# Patient Record
Sex: Female | Born: 1975 | Race: White | Hispanic: No | Marital: Married | Smoking: Never smoker
Health system: Southern US, Community
[De-identification: ages and names within clinical notes are randomized; demographics above are authoritative.]

## PROBLEM LIST (undated history)

## (undated) DIAGNOSIS — A6000 Herpesviral infection of urogenital system, unspecified: Secondary | ICD-10-CM

## (undated) DIAGNOSIS — I10 Essential (primary) hypertension: Secondary | ICD-10-CM

## (undated) DIAGNOSIS — E785 Hyperlipidemia, unspecified: Secondary | ICD-10-CM

## (undated) DIAGNOSIS — F419 Anxiety disorder, unspecified: Secondary | ICD-10-CM

## (undated) DIAGNOSIS — L920 Granuloma annulare: Secondary | ICD-10-CM

## (undated) HISTORY — DX: Hyperlipidemia, unspecified: E78.5

## (undated) HISTORY — DX: Essential (primary) hypertension: I10

## (undated) HISTORY — PX: NO PAST SURGERIES: SHX2092

## (undated) HISTORY — DX: Granuloma annulare: L92.0

## (undated) HISTORY — DX: Anxiety disorder, unspecified: F41.9

## (undated) HISTORY — DX: Herpesviral infection of urogenital system, unspecified: A60.00

---

## 2006-02-08 ENCOUNTER — Observation Stay: Payer: Self-pay

## 2006-05-12 ENCOUNTER — Inpatient Hospital Stay: Payer: Self-pay

## 2014-02-01 ENCOUNTER — Ambulatory Visit: Payer: Self-pay | Admitting: Otolaryngology

## 2014-02-21 ENCOUNTER — Ambulatory Visit: Payer: Self-pay | Admitting: Otolaryngology

## 2014-05-13 LAB — SURGICAL PATHOLOGY

## 2014-05-19 NOTE — Op Note (Signed)
PATIENT NAME:  Kim Martin, Kim Martin MR#:  [id number] DATE OF BIRTH:  [birth year]  DATE OF PROCEDURE:  02/21/2014  PREOPERATIVE DIAGNOSIS: Left submandibular gland mass.   POSTOPERATIVE DIAGNOSIS: Left submandibular gland mass.  PROCEDURE PERFORMED: Submandibular gland excision.   SURGEON: Kyung Ruddreighton C. Jamaar Howes, MD   ASSISTANT: Marion DownerScott Bennett, MD   ANESTHESIA: General endotracheal anesthesia.   ESTIMATED BLOOD LOSS: Less than 10 mL.   INTRAVENOUS FLUIDS: Please see anesthesia record.   COMPLICATIONS: None.   DRAINS AND STENT PLACEMENTS: TLS drain and Surgicel.   SPECIMENS: Left surgical gland excision.   COMPLICATIONS: None.   INDICATIONS FOR PROCEDURE: The patient is a 39 year old female with a history of a left submandibular mass that has been increasing in size, found to have hard firmness and a large lesion on CT scan.   OPERATIVE FINDINGS: A very large left submandibular gland with a mass within the lateral midportion of the gland. The marginal mandibular nerve was identified and preserved. The lingual nerve was identified and preserved. The hypoglossal nerve was identified and preserved.   DESCRIPTION OF PROCEDURE: After the patient was identified in holding, benefits and risks of the procedure were discussed and consent was reviewed. The patient's site was marked. The patient was taken to the operating room and placed in the supine position. General endotracheal anesthesia was induced. The patient was rotated 90 degrees and 5 mL of 1% lidocaine 1:100,000 epinephrine was injected in the patient's left anterior neck, and the left anterior neck crease was marked, and the patient was prepped and draped in a sterile fashion. A 15 blade scalpel was used to make an incision along the marked anterior neck crease. Dissection was carried down through subcutaneous tissues and fat to the platysma muscle, this was divided. Care was taken at this point to avoid injury of the marginal mandibular nerve.  The cervical branch of the facial nerve was identified as well as the marginal mandibular branch more superiorly. The inferior edge of the submandibular gland was identified. The fascia overlying the submandibular gland was incised inferiorly. This was retracted superiorly for protection of the cervical branch as well as the marginal mandibular branch of the facial nerve. This demonstrated a very large lobulated submandibular gland. At this time, a combination of sharp techniques and a Harmonic scalpel and blunt techniques were used to circumferentially dissect around the left submandibular gland more anteriorly, inferiorly and posteriorly.  Posteriorly, a branch off the facial artery was ligated. The facial artery was able to be preserved. Dissection carried more laterally, was noted to have a very hard mass adherent to the overlying fascia. This was carefully dissected with a small-tipped hemostat and this was right at the cervical branch of the facial nerve. At this time, the fascia was able to be retracted more superiorly and the superior dissection of the submandibular gland was made. The mylohyoid muscle was identified. The anterior and posterior belly of the digastric were also identified. The Wharton duct was identified and this was ligated just inferior to the ganglion from the lingual nerve with a Harmonic scalpel and then the remaining attachments as well as a large vein was also ligated with the Harmonic scalpel. The remaining attachments of the submandibular gland next were removed. The wound was copiously irrigated. A specimen was sent for permanent pathological evaluation. Visualization of the wound revealed good hemostasis. The lingual and hypoglossal nerves were identified. The marginal mandibular gland within the fascia was stimulated robustly. At this time, a TLS drain was placed  in the patient's left neck in the wound. The platysma was reapproximated using interrupted 4-0 Vicryl sutures after  placement of Surgicel, and then the subcutaneous tissue was reapproximated with 4-0 Vicryls, and then the skin was closed with Dermabond skin adhesive. Care of the patient at this time was transferred to anesthesia, where the patient tolerated the procedure well.    ____________________________ Kyung Rudd, MD ccv:TT D: 02/21/2014 09:18:14 ET T: 02/21/2014 14:16:59 ET JOB#: [id number]  cc: Kyung Rudd, MD, <Dictator> Kyung Rudd MD ELECTRONICALLY SIGNED 02/28/2014 8:38

## 2014-12-23 DIAGNOSIS — F411 Generalized anxiety disorder: Secondary | ICD-10-CM | POA: Insufficient documentation

## 2014-12-23 DIAGNOSIS — E785 Hyperlipidemia, unspecified: Secondary | ICD-10-CM | POA: Insufficient documentation

## 2015-11-24 DIAGNOSIS — E669 Obesity, unspecified: Secondary | ICD-10-CM | POA: Insufficient documentation

## 2016-01-23 LAB — HM PAP SMEAR: HM PAP: NORMAL

## 2016-02-02 ENCOUNTER — Other Ambulatory Visit: Payer: Self-pay | Admitting: Obstetrics and Gynecology

## 2016-02-02 DIAGNOSIS — Z1231 Encounter for screening mammogram for malignant neoplasm of breast: Secondary | ICD-10-CM

## 2016-02-10 ENCOUNTER — Encounter: Payer: Self-pay | Admitting: Radiology

## 2016-02-10 ENCOUNTER — Ambulatory Visit
Admission: RE | Admit: 2016-02-10 | Discharge: 2016-02-10 | Disposition: A | Discharge status: Home / Self Care | Payer: Managed Care, Other (non HMO) | Source: Ambulatory Visit | Attending: Obstetrics and Gynecology | Admitting: Obstetrics and Gynecology

## 2016-02-10 DIAGNOSIS — Z1231 Encounter for screening mammogram for malignant neoplasm of breast: Secondary | ICD-10-CM | POA: Diagnosis present

## 2016-02-13 ENCOUNTER — Other Ambulatory Visit: Payer: Self-pay | Admitting: Obstetrics and Gynecology

## 2016-02-13 DIAGNOSIS — R928 Other abnormal and inconclusive findings on diagnostic imaging of breast: Secondary | ICD-10-CM

## 2016-02-13 DIAGNOSIS — N6489 Other specified disorders of breast: Secondary | ICD-10-CM

## 2016-02-27 ENCOUNTER — Ambulatory Visit
Admission: RE | Admit: 2016-02-27 | Discharge: 2016-02-27 | Disposition: A | Discharge status: Home / Self Care | Payer: Managed Care, Other (non HMO) | Source: Ambulatory Visit | Attending: Obstetrics and Gynecology | Admitting: Obstetrics and Gynecology

## 2016-02-27 DIAGNOSIS — N6489 Other specified disorders of breast: Secondary | ICD-10-CM

## 2016-02-27 DIAGNOSIS — R928 Other abnormal and inconclusive findings on diagnostic imaging of breast: Secondary | ICD-10-CM

## 2017-01-24 ENCOUNTER — Ambulatory Visit: Payer: Self-pay | Admitting: Obstetrics and Gynecology

## 2017-02-02 ENCOUNTER — Other Ambulatory Visit: Payer: Self-pay | Admitting: Obstetrics and Gynecology

## 2017-02-04 ENCOUNTER — Encounter: Payer: Self-pay | Admitting: Obstetrics and Gynecology

## 2017-02-04 ENCOUNTER — Ambulatory Visit (INDEPENDENT_AMBULATORY_CARE_PROVIDER_SITE_OTHER): Payer: Managed Care, Other (non HMO) | Admitting: Obstetrics and Gynecology

## 2017-02-04 ENCOUNTER — Other Ambulatory Visit: Payer: Self-pay | Admitting: Obstetrics and Gynecology

## 2017-02-04 VITALS — BP 124/78 | Ht 65.0 in | Wt 230.0 lb

## 2017-02-04 DIAGNOSIS — Z1331 Encounter for screening for depression: Secondary | ICD-10-CM

## 2017-02-04 DIAGNOSIS — Z1339 Encounter for screening examination for other mental health and behavioral disorders: Secondary | ICD-10-CM | POA: Diagnosis not present

## 2017-02-04 DIAGNOSIS — Z1231 Encounter for screening mammogram for malignant neoplasm of breast: Secondary | ICD-10-CM

## 2017-02-04 DIAGNOSIS — Z01419 Encounter for gynecological examination (general) (routine) without abnormal findings: Secondary | ICD-10-CM | POA: Diagnosis not present

## 2017-02-04 DIAGNOSIS — N760 Acute vaginitis: Secondary | ICD-10-CM | POA: Diagnosis not present

## 2017-02-04 LAB — POCT WET PREP WITH KOH
Clue Cells Wet Prep HPF POC: NEGATIVE
KOH PREP POC: NEGATIVE
PH, VAGINAL: 4.5
Trichomonas, UA: NEGATIVE

## 2017-02-04 NOTE — Progress Notes (Signed)
Gynecology Annual Exam   PCP: [facility]  Chief Complaint  Patient presents with  . Annual Exam   History of Present Illness:  [name] is a 42 y.o. [phone] established patient whose LMP was No LMP recorded. Patient is not currently having periods (Reason: IUD)., presents today for her annual examination.  Her menses are infrequent and light due to her Mirena IUD.  She does not have vasomotor sx.   She is single partner, contraception - IUD. She does not have vaginal dryness.  Last Pap: 1 year ago  Results were: no abnormalities /neg HPV DNA.  Hx of STDs: HSV  Last mammogram: 1 year ago  Results were: normal--routine follow-up in 12 months There is no FH of breast cancer. There is no FH of ovarian cancer. The patient does not do self-breast exams.  Colonoscopy: not done  DEXA: has not been screened for osteoporosis  Tobacco use: The patient denies current or previous tobacco use. Alcohol use: social drinker Exercise: not active  The patient wears seatbelts: yes.     She has noticed an "onion"-like odor to her discharge. She has no vaginal irritative symptoms.  Her discharge is unchanged.  She has no issues with intercourse. Denies fevers, chills, abdominal pain.  Past Medical History:  Diagnosis Date  . Anxiety   . Herpes genitalis   . Hyperlipidemia    Past Surgical History: denies  Medications   Medication Sig Start Date End Date Taking? Authorizing Provider  levonorgestrel (MIRENA) 20 MCG/24HR IUD 1 each by Intrauterine route once.   Yes [provider]  citalopram (CELEXA) 20 MG tablet Take 20 mg by mouth daily. 12/03/16   [provider]  lisinopril-hydrochlorothiazide (PRINZIDE,ZESTORETIC) 10-12.5 MG tablet Take 1 tablet by mouth daily. 12/03/16   [provider]  lovastatin (MEVACOR) 40 MG tablet TAKE 1 TABLET (40 MG TOTAL) BY MOUTH DAILY WITH DINNER. 12/03/16   [provider]   Allergies: No  Known Allergies  Obstetric History: [id number]  Family History  Problem Relation Age of Onset  . COPD Mother   . Hypercholesterolemia Father   . Breast cancer Neg Hx     Social History   Socioeconomic History  . Marital status: Divorced    Spouse name: Not on file  . Number of children: Not on file  . Years of education: Not on file  . Highest education level: Not on file  Social Needs  . Financial resource strain: Not on file  . Food insecurity - worry: Not on file  . Food insecurity - inability: Not on file  . Transportation needs - medical: Not on file  . Transportation needs - non-medical: Not on file  Occupational History  . Not on file  Tobacco Use  . Smoking status: Never Smoker  . Smokeless tobacco: Never Used  Substance and Sexual Activity  . Alcohol use: No    Frequency: Never  . Drug use: No  . Sexual activity: Yes    Birth control/protection: IUD  Other Topics Concern  . Not on file  Social History Narrative  . Not on file    Review of Systems  Constitutional: Negative.   HENT: Negative.   Eyes: Negative.   Respiratory: Negative.   Cardiovascular: Negative.   Gastrointestinal: Negative.   Genitourinary: Negative.   Musculoskeletal: Negative.   Skin: Negative.   Neurological: Negative.   Psychiatric/Behavioral: Negative.      Physical Exam BP 124/78   Ht  5\' 5"  (1.651 m)   Wt 230 lb (104.3 kg)   BMI 38.27 kg/m   Physical Exam  Constitutional: She is oriented to person, place, and time. She appears well-developed and well-nourished. No distress.  Genitourinary: Uterus normal. Pelvic exam was performed with patient supine. There is no rash, tenderness, lesion or injury on the right labia. There is no rash, tenderness, lesion or injury on the left labia. No erythema, tenderness or bleeding in the vagina. No signs of injury around the vagina. No vaginal discharge found. Right adnexum does not display mass, does not display tenderness and does not  display fullness. Left adnexum does not display mass, does not display tenderness and does not display fullness.  Cervix exhibits visible IUD strings. Cervix does not exhibit motion tenderness, lesion, discharge or polyp.   Uterus is mobile and anteverted. Uterus is not enlarged, tender or exhibiting a mass.  HENT:  Head: Normocephalic and atraumatic.  Eyes: EOM are normal. No scleral icterus.  Neck: Normal range of motion. Neck supple. No thyromegaly present.  Cardiovascular: Normal rate and regular rhythm. Exam reveals no gallop and no friction rub.  No murmur heard. Pulmonary/Chest: Effort normal and breath sounds normal. No respiratory distress. She has no wheezes. She has no rales. Right breast exhibits no inverted nipple, no mass, no nipple discharge, no skin change and no tenderness. Left breast exhibits no inverted nipple, no mass, no nipple discharge, no skin change and no tenderness.  Abdominal: Soft. Bowel sounds are normal. She exhibits no distension and no mass. There is no tenderness. There is no rebound and no guarding.  Musculoskeletal: Normal range of motion. She exhibits no edema or tenderness.  Lymphadenopathy:    She has no cervical adenopathy.       Right: No inguinal adenopathy present.       Left: No inguinal adenopathy present.  Neurological: She is alert and oriented to person, place, and time. No cranial nerve deficit.  Skin: Skin is warm and dry. No rash noted. No erythema.  Psychiatric: She has a normal mood and affect. Her behavior is normal. Judgment normal.   Female chaperone present for pelvic and breast  portions of the physical exam  Wet Prep: PH: 4.5 Clue Cells: Negative Fungal elements: Negative Trichomonas: Negative  Results: AUDIT Questionnaire (screen for alcoholism): 0 PHQ-9: 5  Assessment: 42 y.o. [id number] female here for routine gynecologic examination.  Plan: Problem List Items Addressed This Visit    None    Visit Diagnoses    Women's  annual routine gynecological examination    -  Primary   Screening for depression       Screening for alcoholism       Acute vaginitis       Relevant Orders   POCT Wet Prep with KOH   Chlamydia/Gonococcus/Trichomonas, NAA     Screening: -- Blood pressure screen normal -- Colonoscopy - not due -- Mammogram - due. Patient to call [facility] to arrange. She understands that it is her responsibility to arrange this. -- Weight screening: obese: discussed management options, including lifestyle, dietary, and exercise. -- Depression screening negative (PHQ-9) -- Nutrition: normal -- cholesterol screening: per PCP -- osteoporosis screening: not due -- tobacco screening: not using -- alcohol screening: AUDIT questionnaire indicates low-risk usage. -- family history of breast cancer screening: done. not at high risk. -- no evidence of domestic violence or intimate partner violence. -- STD screening: gonorrhea/chlamydia NAAT collected -- pap smear not collected per ASCCP guidelines --  flu vaccine patient received -- HPV vaccination series: not eligilbe  Thomasene Mohair, MD 02/04/2017 8:58 AM

## 2017-02-08 LAB — CHLAMYDIA/GONOCOCCUS/TRICHOMONAS, NAA
Chlamydia by NAA: NEGATIVE
Gonococcus by NAA: NEGATIVE
Trich vag by NAA: NEGATIVE

## 2017-02-17 ENCOUNTER — Ambulatory Visit
Admission: RE | Admit: 2017-02-17 | Discharge: 2017-02-17 | Disposition: A | Discharge status: Home / Self Care | Payer: Managed Care, Other (non HMO) | Source: Ambulatory Visit | Attending: Obstetrics and Gynecology | Admitting: Obstetrics and Gynecology

## 2017-02-17 DIAGNOSIS — Z1231 Encounter for screening mammogram for malignant neoplasm of breast: Secondary | ICD-10-CM | POA: Diagnosis not present

## 2018-01-09 ENCOUNTER — Other Ambulatory Visit: Payer: Self-pay | Admitting: Obstetrics and Gynecology

## 2018-01-09 DIAGNOSIS — Z1231 Encounter for screening mammogram for malignant neoplasm of breast: Secondary | ICD-10-CM

## 2018-02-06 ENCOUNTER — Ambulatory Visit: Payer: Managed Care, Other (non HMO) | Admitting: Obstetrics and Gynecology

## 2018-02-20 ENCOUNTER — Inpatient Hospital Stay: Admission: RE | Admit: 2018-02-20 | Payer: Managed Care, Other (non HMO) | Source: Ambulatory Visit

## 2018-02-24 ENCOUNTER — Encounter: Payer: Self-pay | Admitting: Obstetrics and Gynecology

## 2018-02-24 ENCOUNTER — Ambulatory Visit (INDEPENDENT_AMBULATORY_CARE_PROVIDER_SITE_OTHER): Payer: Managed Care, Other (non HMO) | Admitting: Obstetrics and Gynecology

## 2018-02-24 VITALS — BP 124/78 | Ht 65.0 in | Wt 228.0 lb

## 2018-02-24 DIAGNOSIS — Z01419 Encounter for gynecological examination (general) (routine) without abnormal findings: Secondary | ICD-10-CM | POA: Diagnosis not present

## 2018-02-24 DIAGNOSIS — Z1339 Encounter for screening examination for other mental health and behavioral disorders: Secondary | ICD-10-CM

## 2018-02-24 DIAGNOSIS — Z1331 Encounter for screening for depression: Secondary | ICD-10-CM

## 2018-02-24 NOTE — Progress Notes (Signed)
Gynecology Annual Exam  PCP: Orene Desanctis, MD  Chief Complaint  Patient presents with  . Annual Exam   History of Present Illness:  [name] is a 43 y.o. [phone] who LMP was No LMP recorded. (Menstrual status: IUD)., presents today for her annual examination.  Her menses are minimal with her IUD.   She does not have vasomotor symptoms.   She is sexually active and uses the IUD for contraception. She does not have vaginal dryness. Mirena IUD placed 11/2014  Last Pap: 01/2016  Results were:  NILM/HPV negative Hx of STDs: HSV  Last mammogram: 01/2017  Results were: BiRads 1 There is no FH of breast cancer. There is no FH of ovarian cancer. The patient does not do self-breast exams.  Colonoscopy: never had DEXA: has not been screened for osteoporosis  Tobacco use: The patient denies current or previous tobacco use. Alcohol use: social drinker Exercise: does walking  The patient wears seatbelts: yes.     Past Medical History:  Diagnosis Date  . Anxiety   . Herpes genitalis   . Hyperlipidemia   . Hypertension    Past Surgical History:  Procedure Laterality Date  . NO PAST SURGERIES     Prior to Admission medications   Medication Sig Start Date End Date Taking? Authorizing Provider  citalopram (CELEXA) 20 MG tablet Take 20 mg by mouth daily. 12/03/16  Yes [provider]  levonorgestrel (MIRENA) 20 MCG/24HR IUD 1 each by Intrauterine route once.   Yes [provider]  lisinopril-hydrochlorothiazide (PRINZIDE,ZESTORETIC) 10-12.5 MG tablet Take 1 tablet by mouth daily. 12/03/16  Yes [provider]  lovastatin (MEVACOR) 40 MG tablet TAKE 1 TABLET (40 MG TOTAL) BY MOUTH DAILY WITH DINNER. 12/03/16  Yes [provider]   Allergies: No Known Allergies  Obstetric History: [id number], s/p SVD  Family History  Problem Relation Age of Onset  . COPD Mother   . Hypercholesterolemia Father   . Breast cancer Neg Hx     Social History    Socioeconomic History  . Marital status: Divorced    Spouse name: Not on file  . Number of children: Not on file  . Years of education: Not on file  . Highest education level: Not on file  Occupational History  . Not on file  Social Needs  . Financial resource strain: Not on file  . Food insecurity:    Worry: Not on file    Inability: Not on file  . Transportation needs:    Medical: Not on file    Non-medical: Not on file  Tobacco Use  . Smoking status: Never Smoker  . Smokeless tobacco: Never Used  Substance and Sexual Activity  . Alcohol use: No    Frequency: Never  . Drug use: No  . Sexual activity: Yes    Birth control/protection: I.U.D.  Lifestyle  . Physical activity:    Days per week: Not on file    Minutes per session: Not on file  . Stress: Not on file  Relationships  . Social connections:    Talks on phone: Not on file    Gets together: Not on file    Attends religious service: Not on file    Active member of club or organization: Not on file    Attends meetings of clubs or organizations: Not on file    Relationship status: Not on file  . Intimate partner violence:    Fear of current or ex partner: Not on  file    Emotionally abused: Not on file    Physically abused: Not on file    Forced sexual activity: Not on file  Other Topics Concern  . Not on file  Social History Narrative  . Not on file    Review of Systems  Constitutional: Negative.   HENT: Negative.   Eyes: Negative.   Respiratory: Negative.   Cardiovascular: Negative.   Gastrointestinal: Negative.   Genitourinary: Negative.   Musculoskeletal: Negative.   Skin: Negative.   Neurological: Negative.   Psychiatric/Behavioral: Negative.      Physical Exam BP 124/78   Ht 5\' 5"  (1.651 m)   Wt 228 lb (103.4 kg)   BMI 37.94 kg/m   Physical Exam Constitutional:      General: She is not in acute distress.    Appearance: She is well-developed.  Genitourinary:     Pelvic exam was  performed with patient supine.     Uterus normal.     No inguinal adenopathy present in the right or left side.    No signs of injury in the vagina.     No vaginal discharge, erythema, tenderness or bleeding.     No cervical motion tenderness, discharge, lesion or polyp.     Uterus is mobile.     Uterus is not enlarged or tender.     No uterine mass detected.    Uterus is anteverted.     No right or left adnexal mass present.     Right adnexa not tender or full.     Left adnexa not tender or full.  HENT:     Head: Normocephalic and atraumatic.  Eyes:     General: No scleral icterus. Neck:     Musculoskeletal: Normal range of motion and neck supple.     Thyroid: No thyromegaly.  Cardiovascular:     Rate and Rhythm: Normal rate and regular rhythm.     Heart sounds: No murmur. No friction rub. No gallop.   Pulmonary:     Effort: Pulmonary effort is normal. No respiratory distress.     Breath sounds: Normal breath sounds. No wheezing or rales.  Chest:     Breasts:        Right: No inverted nipple, mass, nipple discharge, skin change or tenderness.        Left: No inverted nipple, mass, nipple discharge, skin change or tenderness.  Abdominal:     General: Bowel sounds are normal. There is no distension.     Palpations: Abdomen is soft. There is no mass.     Tenderness: There is no abdominal tenderness. There is no guarding or rebound.  Musculoskeletal: Normal range of motion.        General: No tenderness.  Lymphadenopathy:     Cervical: No cervical adenopathy.     Lower Body: No right inguinal adenopathy. No left inguinal adenopathy.  Neurological:     Mental Status: She is alert and oriented to person, place, and time.     Cranial Nerves: No cranial nerve deficit.  Skin:    General: Skin is warm and dry.     Findings: No erythema or rash.  Psychiatric:        Behavior: Behavior normal.        Judgment: Judgment normal.   Female chaperone present for pelvic and breast   portions of the physical exam  Results: AUDIT Questionnaire (screen for alcoholism): 1 PHQ-9: 3  Assessment: 43 y.o. 152P2002 female here for routine  gynecologic examination.  Plan: Problem List Items Addressed This Visit    None    Visit Diagnoses    Women's annual routine gynecological examination    -  Primary   Screening for depression       Screening for alcoholism          Screening: -- Blood pressure screen managed by PCP -- Colonoscopy - not due -- [facility] - [facility] -- Weight screening: obese: discussed management options, including lifestyle, dietary, and exercise. -- Depression screening negative (PHQ-9) -- Nutrition: normal -- cholesterol screening: per PCP -- osteoporosis screening: not due -- tobacco screening: not using -- alcohol screening: AUDIT questionnaire indicates low-risk usage. -- family history of breast cancer screening: done. not at high risk. -- no evidence of domestic violence or intimate partner violence. -- STD screening: gonorrhea/chlamydia NAAT not collected per patient request. -- pap smear not collected per ASCCP guidelines -- flu vaccine patient declined -- HPV vaccination series: not eligilbe  Thomasene MohairStephen Jackson, MD 02/24/2018 8:27 AM

## 2018-02-27 ENCOUNTER — Ambulatory Visit
Admission: RE | Admit: 2018-02-27 | Discharge: 2018-02-27 | Disposition: A | Discharge status: Home / Self Care | Payer: Managed Care, Other (non HMO) | Source: Ambulatory Visit | Attending: Obstetrics and Gynecology | Admitting: Obstetrics and Gynecology

## 2018-02-27 DIAGNOSIS — Z1231 Encounter for screening mammogram for malignant neoplasm of breast: Secondary | ICD-10-CM | POA: Insufficient documentation

## 2018-05-18 IMAGING — MG MM DIGITAL SCREENING BILAT W/ CAD
3 series · 3 of 3 positions shown · non-contrast
Comparison: None.

CLINICAL DATA: Screening. Baseline exam.

EXAM:
DIGITAL SCREENING BILATERAL MAMMOGRAM WITH CAD

[R MLO]
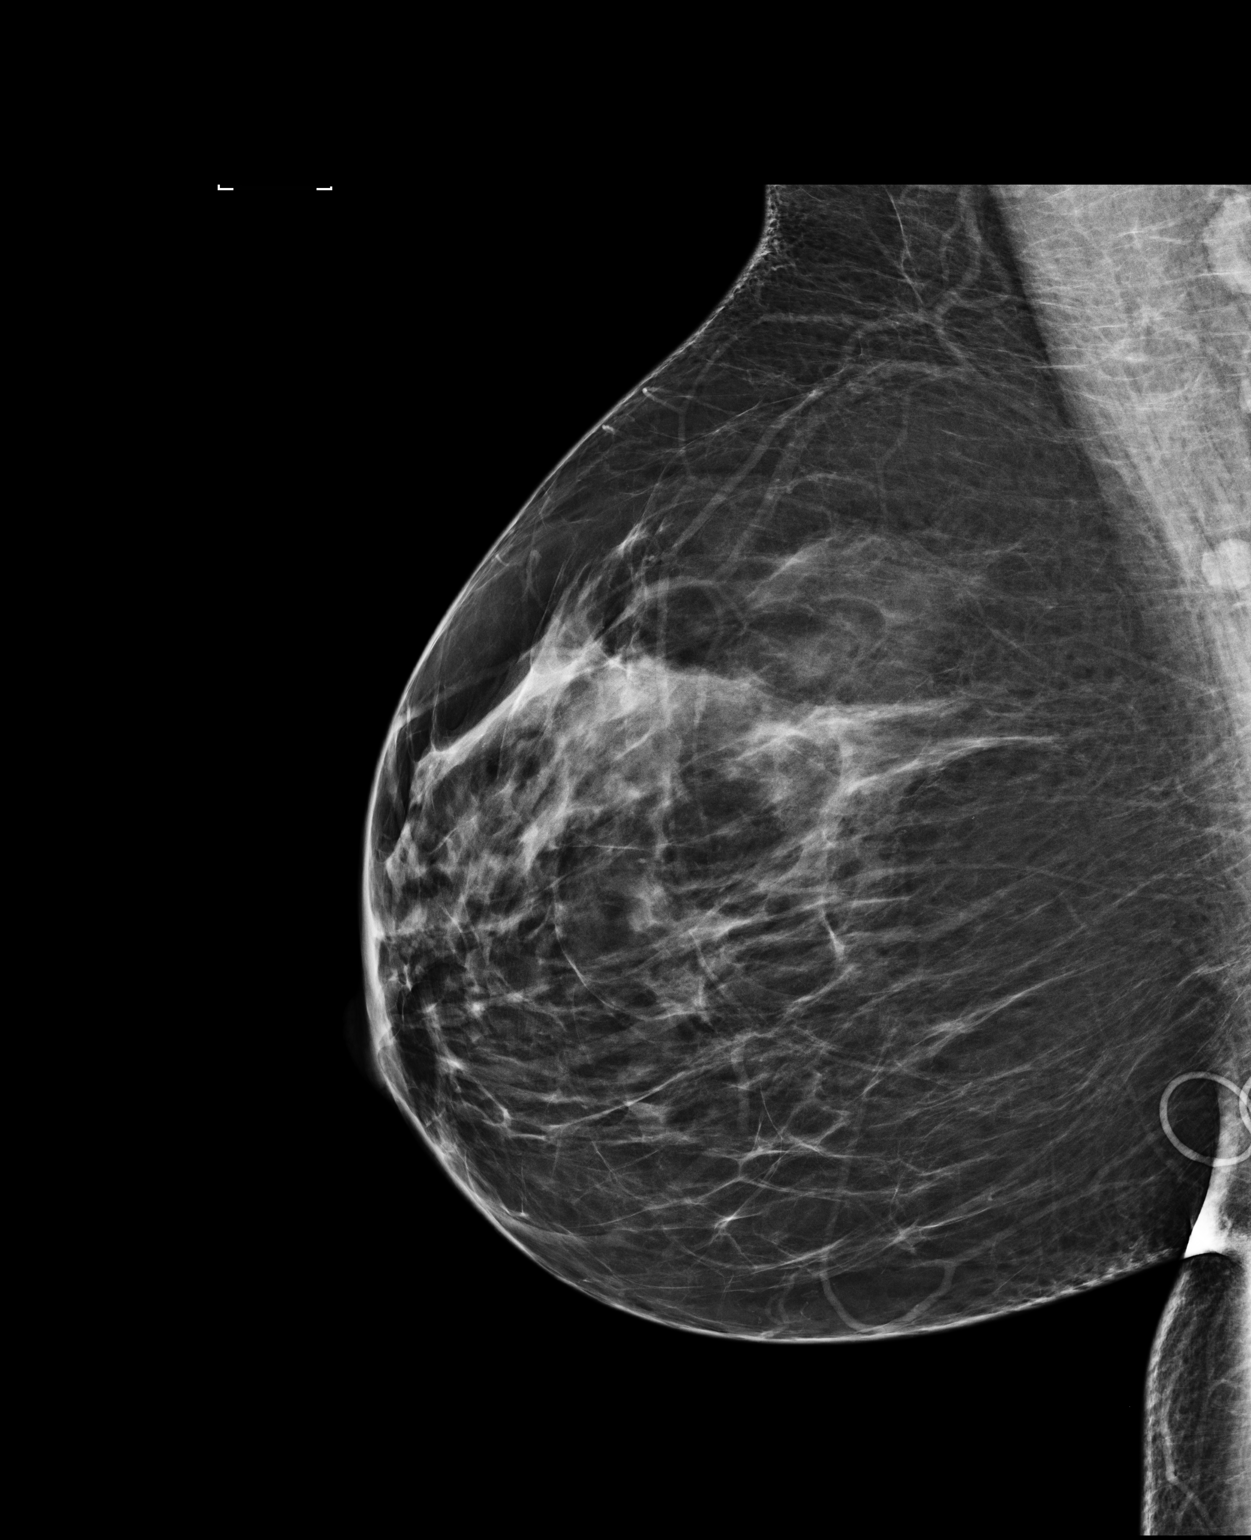

[L MLO]
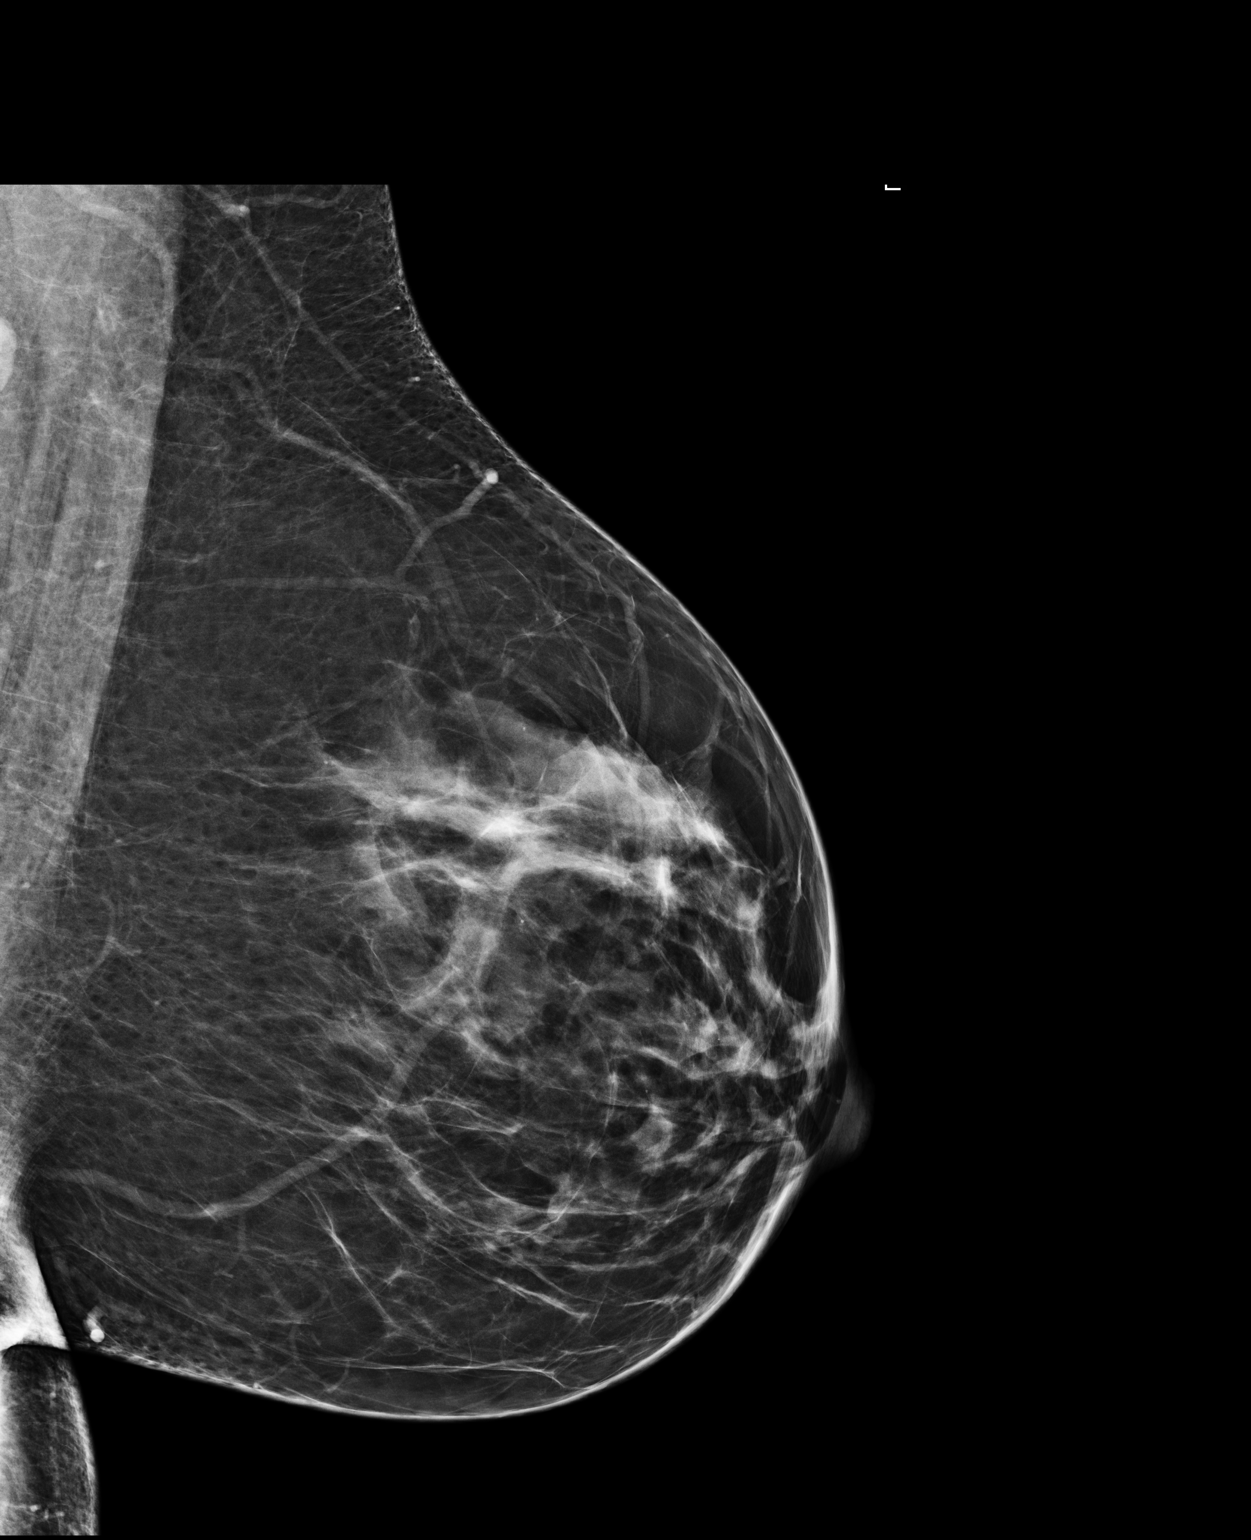

[R CC]
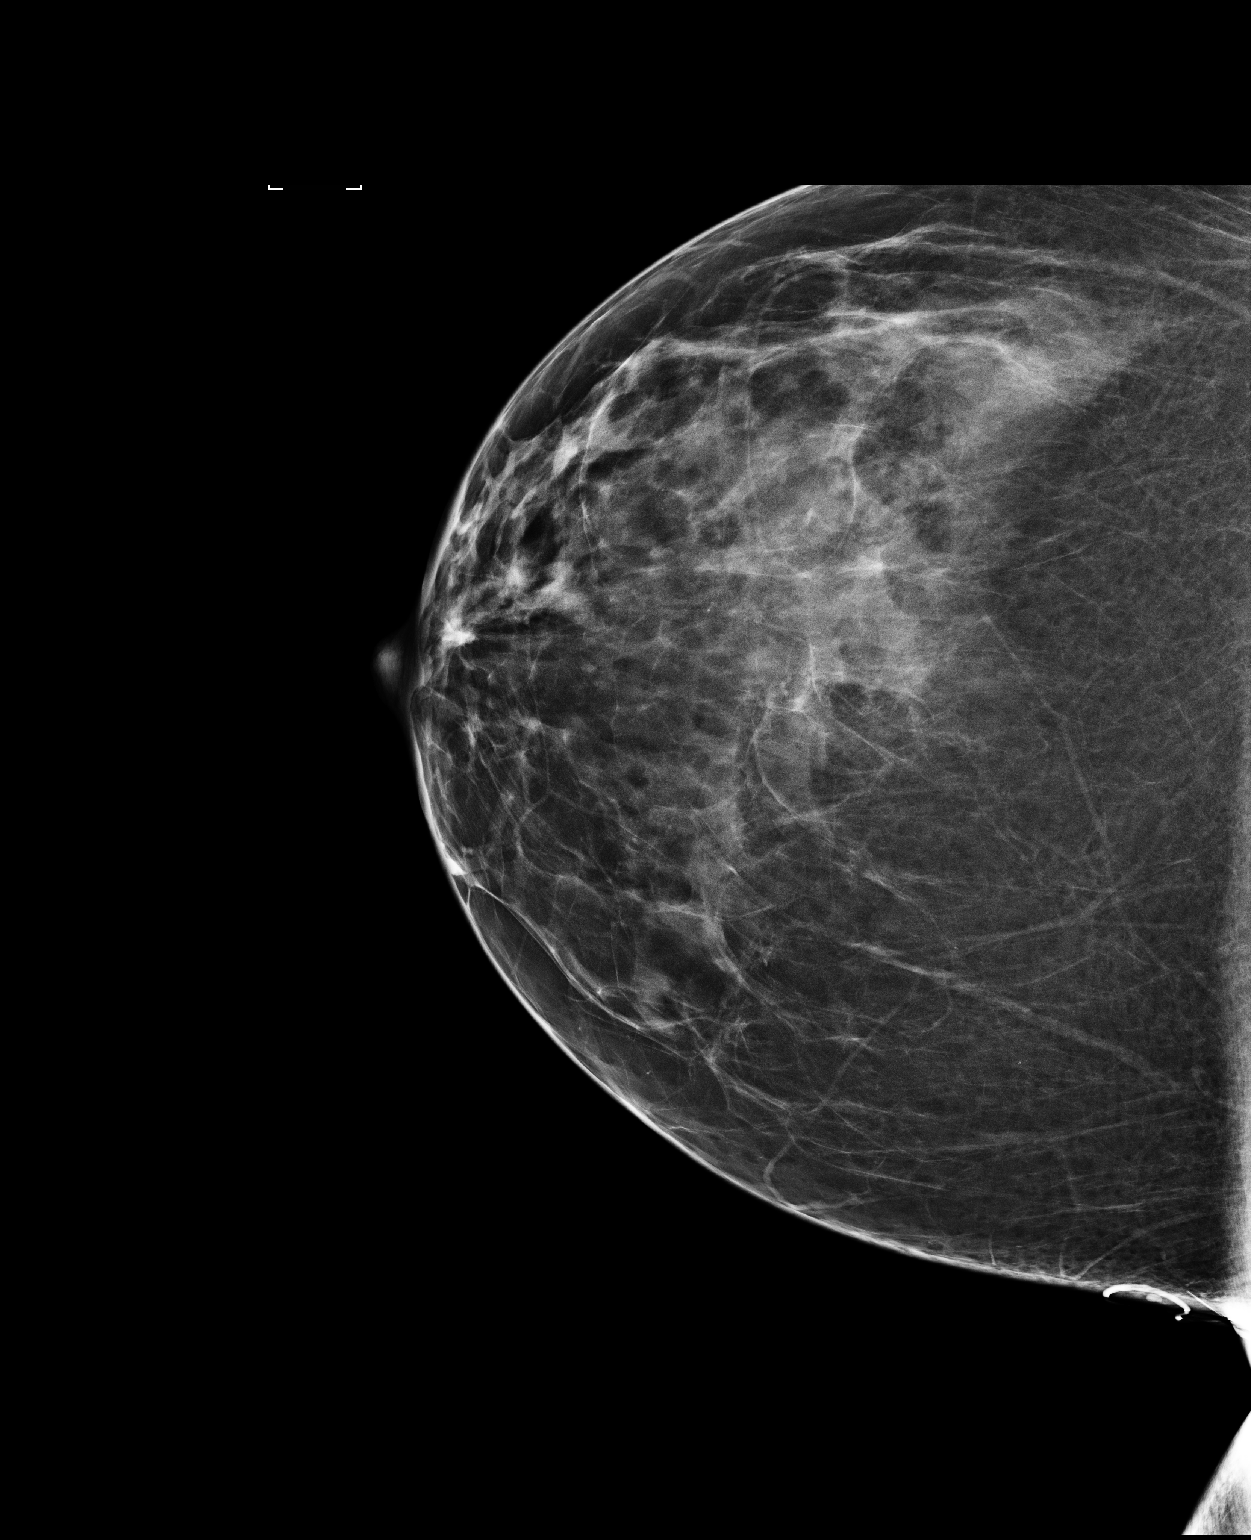

[3 of 3 positions shown; findings below may reference images not displayed]

ACR Breast Density Category b: There are scattered areas of
fibroglandular density.
FINDINGS: In the left breast, a possible asymmetry warrants further
evaluation. In the right breast, no findings suspicious for
malignancy. Images were processed with CAD.
IMPRESSION: Further evaluation is suggested for possible asymmetry in the left
breast.

RECOMMENDATION:
Diagnostic mammogram and possibly ultrasound of the left breast.
(Code:[id number])

The patient will be contacted regarding the findings, and additional
imaging will be scheduled.

BI-RADS CATEGORY  0: Incomplete. Need additional imaging evaluation
and/or prior mammograms for comparison.

## 2018-11-07 DIAGNOSIS — K649 Unspecified hemorrhoids: Secondary | ICD-10-CM | POA: Insufficient documentation

## 2019-02-14 ENCOUNTER — Other Ambulatory Visit: Payer: Self-pay | Admitting: Obstetrics and Gynecology

## 2019-02-14 DIAGNOSIS — Z1231 Encounter for screening mammogram for malignant neoplasm of breast: Secondary | ICD-10-CM

## 2019-03-06 ENCOUNTER — Other Ambulatory Visit: Payer: Self-pay

## 2019-03-06 ENCOUNTER — Ambulatory Visit
Admission: RE | Admit: 2019-03-06 | Discharge: 2019-03-06 | Disposition: A | Discharge status: Home / Self Care | Payer: Managed Care, Other (non HMO) | Source: Ambulatory Visit | Attending: Obstetrics and Gynecology | Admitting: Obstetrics and Gynecology

## 2019-03-06 DIAGNOSIS — Z1231 Encounter for screening mammogram for malignant neoplasm of breast: Secondary | ICD-10-CM | POA: Diagnosis present

## 2019-03-19 ENCOUNTER — Ambulatory Visit: Payer: Self-pay | Admitting: Obstetrics and Gynecology

## 2019-04-13 ENCOUNTER — Ambulatory Visit: Payer: Self-pay | Admitting: Obstetrics and Gynecology

## 2019-05-07 ENCOUNTER — Ambulatory Visit (INDEPENDENT_AMBULATORY_CARE_PROVIDER_SITE_OTHER): Payer: Managed Care, Other (non HMO) | Admitting: Obstetrics and Gynecology

## 2019-05-07 ENCOUNTER — Other Ambulatory Visit (HOSPITAL_COMMUNITY)
Admission: RE | Admit: 2019-05-07 | Discharge: 2019-05-07 | Disposition: A | Discharge status: Home / Self Care | Payer: Managed Care, Other (non HMO) | Source: Ambulatory Visit | Attending: Obstetrics and Gynecology | Admitting: Obstetrics and Gynecology

## 2019-05-07 ENCOUNTER — Encounter: Payer: Self-pay | Admitting: Obstetrics and Gynecology

## 2019-05-07 ENCOUNTER — Other Ambulatory Visit: Payer: Self-pay

## 2019-05-07 VITALS — BP 126/74 | Ht 65.0 in | Wt 229.0 lb

## 2019-05-07 DIAGNOSIS — Z01419 Encounter for gynecological examination (general) (routine) without abnormal findings: Secondary | ICD-10-CM | POA: Insufficient documentation

## 2019-05-07 DIAGNOSIS — Z124 Encounter for screening for malignant neoplasm of cervix: Secondary | ICD-10-CM

## 2019-05-07 DIAGNOSIS — Z1339 Encounter for screening examination for other mental health and behavioral disorders: Secondary | ICD-10-CM | POA: Diagnosis not present

## 2019-05-07 DIAGNOSIS — Z1331 Encounter for screening for depression: Secondary | ICD-10-CM

## 2019-05-07 NOTE — Progress Notes (Signed)
Gynecology Annual Exam  PCP: Orene Desanctis, MD  Chief Complaint  Patient presents with  . Annual Exam   History of Present Illness:  [name] is a 44 y.o. [phone] who LMP was No LMP recorded. (Menstrual status: IUD)., presents today for her annual examination.  Her menses are occasional. She has some bleeding that is like mild bleeding which requires a panty liner. Her IUD was placed about 4 years ago.   She is sexually active. She uses the IUD as birth control. She does not have vaginal dryness.  Last Pap: 3 years  Results were: no abnormalities /neg HPV DNA negative Hx of STDs: HSV (she was told she had this and has never had an outbreak).  Last mammogram: 2 months ago  Results were: normal--routine follow-up in 12 months There is no FH of breast cancer. There is no FH of ovarian cancer. The patient does not do self-breast exams.  Colonoscopy: never had DEXA: has not been screened for osteoporosis  Tobacco use: The patient denies current or previous tobacco use. Alcohol use: social drinker Exercise: none  The patient wears seatbelts: yes.     Past Medical History:  Diagnosis Date  . Anxiety   . Herpes genitalis   . Hyperlipidemia   . Hypertension     Past Surgical History:  Procedure Laterality Date  . NO PAST SURGERIES      Prior to Admission medications   Medication Sig Start Date End Date Taking? Authorizing Provider  citalopram (CELEXA) 20 MG tablet Take 20 mg by mouth daily. 12/03/16  Yes [provider]  levonorgestrel (MIRENA) 20 MCG/24HR IUD 1 each by Intrauterine route once.   Yes [provider]  lisinopril-hydrochlorothiazide (PRINZIDE,ZESTORETIC) 10-12.5 MG tablet Take 1 tablet by mouth daily. 12/03/16  Yes [provider]  lovastatin (MEVACOR) 40 MG tablet TAKE 1 TABLET (40 MG TOTAL) BY MOUTH DAILY WITH DINNER. 12/03/16  Yes [provider]  Trazodone: 25 mg PO HS, PRN  No Known Allergies  Obstetric  History: [id number], s/p SVD x 2  Family History  Problem Relation Age of Onset  . COPD Mother   . Hypercholesterolemia Father   . Breast cancer Neg Hx     Social History   Socioeconomic History  . Marital status: Divorced    Spouse name: Not on file  . Number of children: Not on file  . Years of education: Not on file  . Highest education level: Not on file  Occupational History  . Not on file  Tobacco Use  . Smoking status: Never Smoker  . Smokeless tobacco: Never Used  Substance and Sexual Activity  . Alcohol use: No  . Drug use: No  . Sexual activity: Yes    Birth control/protection: I.U.D.  Other Topics Concern  . Not on file  Social History Narrative  . Not on file   Social Determinants of Health   Financial Resource Strain:   . Difficulty of Paying Living Expenses:   Food Insecurity:   . Worried About Programme researcher, broadcasting/film/video in the Last Year:   . Barista in the Last Year:   Transportation Needs:   . Freight forwarder (Medical):   Marland Kitchen Lack of Transportation (Non-Medical):   Physical Activity:   . Days of Exercise per Week:   . Minutes of Exercise per Session:   Stress:   . Feeling of Stress :   Social Connections:   . Frequency of Communication with Friends  and Family:   . Frequency of Social Gatherings with Friends and Family:   . Attends Religious Services:   . Active Member of Clubs or Organizations:   . Attends Banker Meetings:   Marland Kitchen Marital Status:   Intimate Partner Violence:   . Fear of Current or Ex-Partner:   . Emotionally Abused:   Marland Kitchen Physically Abused:   . Sexually Abused:     Review of Systems  Constitutional: Negative.   HENT: Negative.   Eyes: Negative.   Respiratory: Negative.   Cardiovascular: Negative.   Gastrointestinal: Negative.   Genitourinary: Negative.   Musculoskeletal: Negative.   Skin: Negative.   Neurological: Negative.   Psychiatric/Behavioral: Negative.      Physical Exam BP 126/74   Ht 5'  5" (1.651 m)   Wt 229 lb (103.9 kg)   BMI 38.11 kg/m   Physical Exam Constitutional:      General: She is not in acute distress.    Appearance: Normal appearance. She is well-developed.  Genitourinary:     Pelvic exam was performed with patient in the lithotomy position.     Vulva, urethra, bladder and uterus normal.     No inguinal adenopathy present in the right or left side.    No signs of injury in the vagina.     No vaginal discharge, erythema, tenderness or bleeding.     No cervical motion tenderness, discharge, lesion or polyp.     IUD strings visualized (felt on exam).     Uterus is mobile.     Uterus is not enlarged or tender.     No uterine mass detected.    Uterus is anteverted.     No right or left adnexal mass present.     Right adnexa not tender or full.     Left adnexa not tender or full.  HENT:     Head: Normocephalic and atraumatic.  Eyes:     General: No scleral icterus.    Conjunctiva/sclera: Conjunctivae normal.  Neck:     Thyroid: No thyromegaly.  Cardiovascular:     Rate and Rhythm: Normal rate and regular rhythm.     Heart sounds: No murmur. No friction rub. No gallop.   Pulmonary:     Effort: Pulmonary effort is normal. No respiratory distress.     Breath sounds: Normal breath sounds. No wheezing or rales.  Chest:     Breasts:        Right: No inverted nipple, mass, nipple discharge, skin change or tenderness.        Left: No inverted nipple, mass, nipple discharge, skin change or tenderness.  Abdominal:     General: Bowel sounds are normal. There is no distension.     Palpations: Abdomen is soft. There is no mass.     Tenderness: There is no abdominal tenderness. There is no guarding or rebound.  Musculoskeletal:        General: No swelling or tenderness. Normal range of motion.     Cervical back: Normal range of motion and neck supple.  Lymphadenopathy:     Cervical: No cervical adenopathy.     Lower Body: No right inguinal adenopathy. No  left inguinal adenopathy.  Neurological:     General: No focal deficit present.     Mental Status: She is alert and oriented to person, place, and time.     Cranial Nerves: No cranial nerve deficit.  Skin:    General: Skin is warm and dry.  Findings: No erythema or rash.  Psychiatric:        Mood and Affect: Mood normal.        Behavior: Behavior normal.        Judgment: Judgment normal.    Female chaperone present for pelvic and breast  portions of the physical exam  Bedside u/s shows IUD in apparently correct location  Results: AUDIT Questionnaire (screen for alcoholism): 1 PHQ-9: 5  Assessment: 44 y.o. [id number] female here for routine gynecologic examination.  Plan: Problem List Items Addressed This Visit    None    Visit Diagnoses    Women's annual routine gynecological examination    -  Primary   Relevant Orders   Cytology - PAP   Screening for depression       Screening for alcoholism       Pap smear for cervical cancer screening       Relevant Orders   Cytology - PAP      Screening: -- Blood pressure screen managed by PCP -- Colonoscopy - not due -- Mammogram - not due -- Weight screening: obese: discussed management options, including lifestyle, dietary, and exercise. -- Depression screening negative (PHQ-9) -- Nutrition: normal -- cholesterol screening: per PCP -- osteoporosis screening: not due -- tobacco screening: not using -- alcohol screening: AUDIT questionnaire indicates low-risk usage. -- family history of breast cancer screening: done. not at high risk. -- no evidence of domestic violence or intimate partner violence. -- STD screening: gonorrhea/chlamydia NAAT not collected per patient request. -- pap smear collected per ASCCP guidelines  Prentice Docker, MD 05/07/2019 11:52 AM

## 2019-05-10 LAB — CYTOLOGY - PAP
Comment: NEGATIVE
Comment: NEGATIVE
Comment: NEGATIVE
Diagnosis: NEGATIVE
HPV 16: NEGATIVE
HPV 18 / 45: NEGATIVE
High risk HPV: POSITIVE — AB

## 2019-05-11 ENCOUNTER — Telehealth: Payer: Self-pay | Admitting: Obstetrics and Gynecology

## 2019-05-11 NOTE — Telephone Encounter (Signed)
Discussed finding of pap smear result, NILM, HPV+ (HPV 16/18 negative).  Recommendation for repeat pap smear in 1 year per ASCCP guidelines as her last pap smear was NILM, HPV negative.  All questions answered.

## 2020-01-22 ENCOUNTER — Other Ambulatory Visit: Payer: Self-pay | Admitting: Obstetrics and Gynecology

## 2020-01-22 DIAGNOSIS — Z1231 Encounter for screening mammogram for malignant neoplasm of breast: Secondary | ICD-10-CM

## 2020-03-06 ENCOUNTER — Ambulatory Visit
Admission: RE | Admit: 2020-03-06 | Discharge: 2020-03-06 | Disposition: A | Discharge status: Home / Self Care | Payer: Managed Care, Other (non HMO) | Source: Ambulatory Visit | Attending: Obstetrics and Gynecology | Admitting: Obstetrics and Gynecology

## 2020-03-06 ENCOUNTER — Other Ambulatory Visit: Payer: Self-pay

## 2020-03-06 DIAGNOSIS — Z1231 Encounter for screening mammogram for malignant neoplasm of breast: Secondary | ICD-10-CM | POA: Insufficient documentation

## 2020-04-23 DIAGNOSIS — I10 Essential (primary) hypertension: Secondary | ICD-10-CM | POA: Insufficient documentation

## 2020-07-16 ENCOUNTER — Other Ambulatory Visit: Payer: Self-pay

## 2020-07-16 ENCOUNTER — Encounter: Payer: Self-pay | Admitting: Obstetrics and Gynecology

## 2020-07-16 ENCOUNTER — Ambulatory Visit (INDEPENDENT_AMBULATORY_CARE_PROVIDER_SITE_OTHER): Payer: Managed Care, Other (non HMO) | Admitting: Obstetrics and Gynecology

## 2020-07-16 ENCOUNTER — Other Ambulatory Visit (HOSPITAL_COMMUNITY)
Admission: RE | Admit: 2020-07-16 | Discharge: 2020-07-16 | Disposition: A | Discharge status: Home / Self Care | Payer: Managed Care, Other (non HMO) | Source: Ambulatory Visit | Attending: Obstetrics and Gynecology | Admitting: Obstetrics and Gynecology

## 2020-07-16 VITALS — BP 112/72 | HR 100 | Ht 65.0 in | Wt 221.0 lb

## 2020-07-16 DIAGNOSIS — R87618 Other abnormal cytological findings on specimens from cervix uteri: Secondary | ICD-10-CM | POA: Insufficient documentation

## 2020-07-16 DIAGNOSIS — Z01419 Encounter for gynecological examination (general) (routine) without abnormal findings: Secondary | ICD-10-CM | POA: Insufficient documentation

## 2020-07-16 DIAGNOSIS — Z1339 Encounter for screening examination for other mental health and behavioral disorders: Secondary | ICD-10-CM | POA: Diagnosis not present

## 2020-07-16 DIAGNOSIS — Z1331 Encounter for screening for depression: Secondary | ICD-10-CM

## 2020-07-16 NOTE — Progress Notes (Signed)
Gynecology Annual Exam  PCP: Orene Desanctis, MD  Chief Complaint  Patient presents with   Annual Exam   History of Present Illness:  [name] is a 45 y.o. [phone] who LMP was No LMP recorded. (Menstrual status: IUD)., presents today for her annual examination.  Her menses are occasional. She has some bleeding that is like mild bleeding which requires a panty liner. Her IUD was placed about 5 years ago.   She is sexually active. She uses the IUD as birth control. She does not have vaginal dryness.  Last Pap: 1 year  Results were: no abnormalities /neg HPV DNA POSITIVE (16/18 negative) Hx of STDs: HSV (she was told she had this and has never had an outbreak).  Last mammogram: 4 months ago  Results were: normal--routine follow-up in 12 months There is no FH of breast cancer. There is no FH of ovarian cancer. The patient does not do self-breast exams.  Colonoscopy: had Colorectal test through PCP with "[facility]" DEXA: has not been screened for osteoporosis  Tobacco use: The patient denies current or previous tobacco use. Alcohol use: social drinker Exercise: none  The patient wears seatbelts: yes.     Past Medical History:  Diagnosis Date   Anxiety    Herpes genitalis    Hyperlipidemia    Hypertension     Past Surgical History:  Procedure Laterality Date   NO PAST SURGERIES      Prior to Admission medications   Medication Sig Start Date End Date Taking? Authorizing Provider  citalopram (CELEXA) 20 MG tablet Take 20 mg by mouth daily. 12/03/16  Yes [provider]  levonorgestrel (MIRENA) 20 MCG/24HR IUD 1 each by Intrauterine route once.   Yes [provider]  lisinopril-hydrochlorothiazide (PRINZIDE,ZESTORETIC) 10-12.5 MG tablet Take 1 tablet by mouth daily. 12/03/16  Yes [provider]  lovastatin (MEVACOR) 40 MG tablet TAKE 1 TABLET (40 MG TOTAL) BY MOUTH DAILY WITH DINNER. 12/03/16  Yes [provider]  Trazodone: 25 mg PO  HS, PRN  Allergies: No Known Allergies  Obstetric History: G2P2002, s/p SVD x 2  Family History  Problem Relation Age of Onset   COPD Mother    Hypercholesterolemia Father    Breast cancer Neg Hx     Social History   Socioeconomic History   Marital status: Married    Spouse name: Not on file   Number of children: Not on file   Years of education: Not on file   Highest education level: Not on file  Occupational History   Not on file  Tobacco Use   Smoking status: Never   Smokeless tobacco: Never  Vaping Use   Vaping Use: Never used  Substance and Sexual Activity   Alcohol use: No   Drug use: No   Sexual activity: Yes    Birth control/protection: I.U.D.  Other Topics Concern   Not on file  Social History Narrative   Not on file   Social Determinants of Health   Financial Resource Strain: Not on file  Food Insecurity: Not on file  Transportation Needs: Not on file  Physical Activity: Not on file  Stress: Not on file  Social Connections: Not on file  Intimate Partner Violence: Not on file    Review of Systems  Constitutional: Negative.   HENT: Negative.    Eyes: Negative.   Respiratory: Negative.    Cardiovascular: Negative.   Gastrointestinal: Negative.   Genitourinary: Negative.   Musculoskeletal: Negative.  Skin: Negative.   Neurological: Negative.   Psychiatric/Behavioral: Negative.      Physical Exam BP 112/72 (Cuff Size: Normal)   Pulse 100   Ht 5\' 5"  (1.651 m)   Wt 221 lb (100.2 kg)   BMI 36.78 kg/m   Physical Exam Constitutional:      General: She is not in acute distress.    Appearance: Normal appearance. She is well-developed.  Genitourinary:     Vulva and bladder normal.     No vaginal discharge, erythema, tenderness or bleeding.      Right Adnexa: not tender, not full and no mass present.    Left Adnexa: not tender, not full and no mass present.    No cervical motion tenderness, discharge, lesion or polyp.     IUD strings  visualized (felt on exam).     Uterus is not enlarged or tender.     No uterine mass detected.    Pelvic exam was performed with patient in the lithotomy position.  Breasts:    Right: No inverted nipple, mass, nipple discharge, skin change or tenderness.     Left: No inverted nipple, mass, nipple discharge, skin change or tenderness.  HENT:     Head: Normocephalic and atraumatic.  Eyes:     General: No scleral icterus.    Conjunctiva/sclera: Conjunctivae normal.  Neck:     Thyroid: No thyromegaly.  Cardiovascular:     Rate and Rhythm: Normal rate and regular rhythm.     Heart sounds: No murmur heard.   No friction rub. No gallop.  Pulmonary:     Effort: Pulmonary effort is normal. No respiratory distress.     Breath sounds: Normal breath sounds. No wheezing or rales.  Abdominal:     General: Bowel sounds are normal. There is no distension.     Palpations: Abdomen is soft. There is no mass.     Tenderness: There is no abdominal tenderness. There is no guarding or rebound.  Musculoskeletal:        General: No swelling or tenderness. Normal range of motion.     Cervical back: Normal range of motion and neck supple.  Lymphadenopathy:     Cervical: No cervical adenopathy.     Lower Body: No right inguinal adenopathy. No left inguinal adenopathy.  Neurological:     General: No focal deficit present.     Mental Status: She is alert and oriented to person, place, and time.     Cranial Nerves: No cranial nerve deficit.  Skin:    General: Skin is warm and dry.     Findings: No erythema or rash.  Psychiatric:        Mood and Affect: Mood normal.        Behavior: Behavior normal.        Judgment: Judgment normal.   Female chaperone present for pelvic and breast  portions of the physical exam  Bedside u/s shows IUD in apparently correct location  Results: AUDIT Questionnaire (screen for alcoholism): 1 PHQ-9: 2  Assessment: 45 y.o. 45 female here for routine gynecologic  examination.  Plan: Problem List Items Addressed This Visit   None Visit Diagnoses     Women's annual routine gynecological examination    -  Primary   Relevant Orders   Cytology - PAP   Screening for depression       Screening for alcoholism       Pap smear abnormality of cervix/human papillomavirus (HPV) positive  Relevant Orders   Cytology - PAP      Screening: -- Blood pressure screen managed by PCP -- Colonoscopy - not due -- Mammogram - not due -- Weight screening: obese: discussed management options, including lifestyle, dietary, and exercise. -- Depression screening negative (PHQ-9) -- Nutrition: normal -- cholesterol screening: per PCP -- osteoporosis screening: not due -- tobacco screening: not using -- alcohol screening: AUDIT questionnaire indicates low-risk usage. -- family history of breast cancer screening: done. not at high risk. -- no evidence of domestic violence or intimate partner violence. -- STD screening: gonorrhea/chlamydia NAAT not collected per patient request. -- pap smear collected per ASCCP guidelines  Thomasene Mohair, MD 07/16/2020 9:00 AM

## 2020-07-22 LAB — CYTOLOGY - PAP
Comment: NEGATIVE
Diagnosis: NEGATIVE
High risk HPV: POSITIVE — AB

## 2020-09-18 ENCOUNTER — Ambulatory Visit (INDEPENDENT_AMBULATORY_CARE_PROVIDER_SITE_OTHER): Payer: Managed Care, Other (non HMO) | Admitting: Obstetrics and Gynecology

## 2020-09-18 ENCOUNTER — Other Ambulatory Visit: Payer: Self-pay

## 2020-09-18 ENCOUNTER — Other Ambulatory Visit (HOSPITAL_COMMUNITY)
Admission: RE | Admit: 2020-09-18 | Discharge: 2020-09-18 | Disposition: A | Discharge status: Home / Self Care | Payer: Managed Care, Other (non HMO) | Source: Ambulatory Visit | Attending: Obstetrics and Gynecology | Admitting: Obstetrics and Gynecology

## 2020-09-18 ENCOUNTER — Encounter: Payer: Self-pay | Admitting: Obstetrics and Gynecology

## 2020-09-18 VITALS — BP 126/78 | Ht 65.0 in | Wt 230.0 lb

## 2020-09-18 DIAGNOSIS — R87618 Other abnormal cytological findings on specimens from cervix uteri: Secondary | ICD-10-CM | POA: Diagnosis not present

## 2020-09-18 NOTE — Progress Notes (Signed)
HPI:  Kim Martin is a 45 y.o.  [phone]  who presents today for evaluation and management of abnormal cervical cytology.    Dysplasia History:   07/16/2020 - pap NILM, HPV + 05/07/2019 - pap NILM, HPV + (16/18 negative)  OB History  Gravida Para Term Preterm AB Living  2 2 2     2   SAB IAB Ectopic Multiple Live Births          2    # Outcome Date GA Lbr Len/2nd Weight Sex Delivery Anes PTL Lv  2 Term           1 Term             Past Medical History:  Diagnosis Date   Anxiety    Herpes genitalis    Hyperlipidemia    Hypertension     Past Surgical History:  Procedure Laterality Date   NO PAST SURGERIES      SOCIAL HISTORY:  Social History   Substance and Sexual Activity  Alcohol Use No    Social History   Substance and Sexual Activity  Drug Use No     Family History  Problem Relation Age of Onset   COPD Mother    Hypercholesterolemia Father    Breast cancer Neg Hx     ALLERGIES:  Patient has no known allergies.  Current Outpatient Medications on File Prior to Visit  Medication Sig Dispense Refill   citalopram (CELEXA) 20 MG tablet Take 20 mg by mouth daily.  2   levonorgestrel (MIRENA) 20 MCG/24HR IUD 1 each by Intrauterine route once.     lisinopril-hydrochlorothiazide (PRINZIDE,ZESTORETIC) 10-12.5 MG tablet Take 1 tablet by mouth daily.  11   lovastatin (MEVACOR) 40 MG tablet TAKE 1 TABLET (40 MG TOTAL) BY MOUTH DAILY WITH DINNER.  9   Multiple Vitamin (MULTI-VITAMIN) tablet Take 1 tablet by mouth daily.     traZODone (DESYREL) 50 MG tablet Take 50 mg by mouth at bedtime as needed.     No current facility-administered medications on file prior to visit.    Physical Exam: -Vitals:  BP 126/78   Ht 5\' 5"  (1.651 m)   Wt 230 lb (104.3 kg)   BMI 38.27 kg/m  GEN: WD, WN, NAD.  A+ O x 3, good mood and affect. ABD:  NT, ND.  Soft, no masses.  No hernias noted.   Pelvic:   Vulva: Normal appearance.  No lesions.  Vagina: No lesions or  abnormalities noted.  Support: Normal pelvic support.  Urethra No masses tenderness or scarring.  Meatus Normal size without lesions or prolapse.  Cervix: See below.  Anus: Normal exam.  No lesions.  Perineum: Normal exam.  No lesions.        Bimanual   Uterus: Normal size.  Non-tender.  Mobile.  AV.  Adnexae: No masses.  Non-tender to palpation.  Cul-de-sac: Negative for abnormality.   PROCEDURE: 1.  Urine Pregnancy Test:  not done due to IUD 2.  Colposcopy performed with 4% acetic acid after verbal consent obtained                                         -Aceto-white Lesions Location(s): mildly diffusely               -Biopsy performed at 4, 7, 12 o'clock               -  ECC indicated and performed: Yes.       -Biopsy sites made hemostatic with pressure, AgNO3, and/or Monsel's solution   -Satisfactory colposcopy: No.    -Evidence of Invasive cervical CA :  NO  ASSESSMENT:  Kim Martin is a 45 y.o. [id number] here for  1. Pap smear abnormality of cervix/human papillomavirus (HPV) positive   .  PLAN: I discussed the grading system of pap smears and HPV high risk viral types.  We will discuss and base management after colpo results return.      Kim Mohair, MD  [facility], [facility] 09/18/2020  5:37 PM

## 2020-09-23 LAB — SURGICAL PATHOLOGY

## 2021-04-28 DIAGNOSIS — L920 Granuloma annulare: Secondary | ICD-10-CM | POA: Insufficient documentation

## 2021-07-17 ENCOUNTER — Other Ambulatory Visit (HOSPITAL_COMMUNITY)
Admission: RE | Admit: 2021-07-17 | Discharge: 2021-07-17 | Disposition: A | Discharge status: Home / Self Care | Payer: Managed Care, Other (non HMO) | Source: Ambulatory Visit | Attending: Licensed Practical Nurse | Admitting: Licensed Practical Nurse

## 2021-07-17 ENCOUNTER — Ambulatory Visit (INDEPENDENT_AMBULATORY_CARE_PROVIDER_SITE_OTHER): Payer: Managed Care, Other (non HMO) | Admitting: Advanced Practice Midwife

## 2021-07-17 ENCOUNTER — Ambulatory Visit: Payer: Managed Care, Other (non HMO) | Admitting: Licensed Practical Nurse

## 2021-07-17 ENCOUNTER — Encounter: Payer: Self-pay | Admitting: Advanced Practice Midwife

## 2021-07-17 VITALS — BP 120/80 | Ht 65.0 in | Wt 226.0 lb

## 2021-07-17 DIAGNOSIS — Z124 Encounter for screening for malignant neoplasm of cervix: Secondary | ICD-10-CM

## 2021-07-17 DIAGNOSIS — Z01419 Encounter for gynecological examination (general) (routine) without abnormal findings: Secondary | ICD-10-CM | POA: Diagnosis not present

## 2021-07-17 DIAGNOSIS — Z1239 Encounter for other screening for malignant neoplasm of breast: Secondary | ICD-10-CM

## 2021-07-17 NOTE — Progress Notes (Addendum)
Patient ID: Kim Martin, female   DOB: 1975/08/19, 46 y.o.   MRN: [id number]  Reason for Consult: PAP, clinical breast exam   Referred by Orene Desanctis, MD  Subjective:  HPI:  Kim Martin is a 46 y.o. female being seen for PAP smear and clinical breast exam. She has no gyn or breast concerns. Her PCP manages labs and medications. Her last PAP was 07/16/2020 NIL/+HR HPV. She has a history of normal mammograms. She had a normal screening for colon cancer with Cologuard this year. She admits working on healthy lifestyle; diet, hydration, exercise. She uses IUD for contraception- placed 6 years ago. (Note from Dr Jean Rosenthal last year- IUD placed about 5 years ago).  Past Medical History:  Diagnosis Date   Anxiety    Granuloma annulare    Herpes genitalis    Hyperlipidemia    Hypertension    Family History  Problem Relation Age of Onset   COPD Mother    Hypercholesterolemia Father    Breast cancer Neg Hx    Past Surgical History:  Procedure Laterality Date   NO PAST SURGERIES      Short Social History:  Social History   Tobacco Use   Smoking status: Never   Smokeless tobacco: Never  Substance Use Topics   Alcohol use: No    No Known Allergies  Current Outpatient Medications  Medication Sig Dispense Refill   citalopram (CELEXA) 20 MG tablet Take 20 mg by mouth daily.  2   levonorgestrel (MIRENA) 20 MCG/24HR IUD 1 each by Intrauterine route once.     lisinopril-hydrochlorothiazide (PRINZIDE,ZESTORETIC) 10-12.5 MG tablet Take 1 tablet by mouth daily.  11   lovastatin (MEVACOR) 40 MG tablet TAKE 1 TABLET (40 MG TOTAL) BY MOUTH DAILY WITH DINNER.  9   Multiple Vitamin (MULTI-VITAMIN) tablet Take 1 tablet by mouth daily.     traZODone (DESYREL) 50 MG tablet Take 50 mg by mouth at bedtime as needed.     No current facility-administered medications for this visit.    Review of Systems  Constitutional:  Negative for chills and fever.  HENT:  Negative for congestion,  ear discharge, ear pain, hearing loss, sinus pain and sore throat.   Eyes:  Negative for blurred vision and double vision.  Respiratory:  Negative for cough, shortness of breath and wheezing.   Cardiovascular:  Negative for chest pain, palpitations and leg swelling.  Gastrointestinal:  Negative for abdominal pain, blood in stool, constipation, diarrhea, heartburn, melena, nausea and vomiting.  Genitourinary:  Negative for dysuria, flank pain, frequency, hematuria and urgency.  Musculoskeletal:  Negative for back pain, joint pain and myalgias.  Skin:  Negative for itching and rash.  Neurological:  Negative for dizziness, tingling, tremors, sensory change, speech change, focal weakness, seizures, loss of consciousness, weakness and headaches.  Endo/Heme/Allergies:  Positive for environmental allergies. Does not bruise/bleed easily.       Positive for hot flash  Psychiatric/Behavioral:  Negative for depression, hallucinations, memory loss, substance abuse and suicidal ideas. The patient is not nervous/anxious and does not have insomnia.        Positive for anxiety        Objective:  Objective   Vitals:   07/17/21 0829  BP: 120/80  Weight: 226 lb (102.5 kg)  Height: 5\' 5"  (1.651 m)   Body mass index is 37.61 kg/m. Constitutional: Well nourished, well developed female in no acute distress.  HEENT: normal Skin: Warm and dry.  Extremity:  no edema  Respiratory:  Normal respiratory effort Neuro: DTRs 2+, Cranial nerves grossly intact Psych: Alert and Oriented x3. No memory deficits. Normal mood and affect.    Pelvic exam:  is not limited by body habitus EGBUS: within normal limits Vagina: within normal limits and with normal mucosa  Cervix: normal appearance, IUD strings visualized 2cm   Assessment/Plan:     46 y.o. G2 P2 female cervical cancer screening and clinical breast exam  PAP done today Mammogram ordered Return to clinic as needed for PAP/Gyn/breast  concerns   Tresea Mall CNM [facility] 07/17/2021, 9:37 AM

## 2021-07-22 LAB — CYTOLOGY - PAP
Comment: NEGATIVE
Diagnosis: UNDETERMINED — AB
High risk HPV: NEGATIVE

## 2021-08-20 ENCOUNTER — Ambulatory Visit
Admission: RE | Admit: 2021-08-20 | Discharge: 2021-08-20 | Disposition: A | Discharge status: Home / Self Care | Payer: Managed Care, Other (non HMO) | Source: Ambulatory Visit | Attending: Advanced Practice Midwife | Admitting: Advanced Practice Midwife

## 2021-08-20 DIAGNOSIS — I2510 Atherosclerotic heart disease of native coronary artery without angina pectoris: Secondary | ICD-10-CM | POA: Insufficient documentation

## 2021-08-20 DIAGNOSIS — Z1239 Encounter for other screening for malignant neoplasm of breast: Secondary | ICD-10-CM | POA: Insufficient documentation

## 2021-08-20 DIAGNOSIS — Z1231 Encounter for screening mammogram for malignant neoplasm of breast: Secondary | ICD-10-CM | POA: Diagnosis not present

## 2021-08-21 ENCOUNTER — Other Ambulatory Visit: Payer: Self-pay | Admitting: Advanced Practice Midwife

## 2021-08-21 DIAGNOSIS — N6489 Other specified disorders of breast: Secondary | ICD-10-CM

## 2021-08-21 DIAGNOSIS — R928 Other abnormal and inconclusive findings on diagnostic imaging of breast: Secondary | ICD-10-CM

## 2021-09-09 ENCOUNTER — Ambulatory Visit
Admission: RE | Admit: 2021-09-09 | Discharge: 2021-09-09 | Disposition: A | Discharge status: Home / Self Care | Payer: Managed Care, Other (non HMO) | Source: Ambulatory Visit | Attending: Advanced Practice Midwife | Admitting: Advanced Practice Midwife

## 2021-09-09 DIAGNOSIS — N6489 Other specified disorders of breast: Secondary | ICD-10-CM | POA: Insufficient documentation

## 2021-09-09 DIAGNOSIS — R928 Other abnormal and inconclusive findings on diagnostic imaging of breast: Secondary | ICD-10-CM | POA: Diagnosis present

## 2022-04-12 ENCOUNTER — Encounter: Payer: Self-pay | Admitting: Advanced Practice Midwife

## 2022-04-15 ENCOUNTER — Other Ambulatory Visit: Payer: Self-pay | Admitting: Advanced Practice Midwife

## 2022-04-15 DIAGNOSIS — Z1239 Encounter for other screening for malignant neoplasm of breast: Secondary | ICD-10-CM

## 2022-04-15 NOTE — Progress Notes (Signed)
Annual mammogram ordered- due after 09/09/21

## 2022-04-29 DIAGNOSIS — R7303 Prediabetes: Secondary | ICD-10-CM | POA: Insufficient documentation

## 2022-08-13 ENCOUNTER — Encounter: Payer: Self-pay | Admitting: Advanced Practice Midwife

## 2022-08-13 ENCOUNTER — Other Ambulatory Visit (HOSPITAL_COMMUNITY)
Admission: RE | Admit: 2022-08-13 | Discharge: 2022-08-13 | Disposition: A | Discharge status: Home / Self Care | Payer: Managed Care, Other (non HMO) | Source: Ambulatory Visit | Attending: Advanced Practice Midwife | Admitting: Advanced Practice Midwife

## 2022-08-13 ENCOUNTER — Other Ambulatory Visit: Payer: Managed Care, Other (non HMO)

## 2022-08-13 ENCOUNTER — Ambulatory Visit: Payer: Managed Care, Other (non HMO) | Admitting: Advanced Practice Midwife

## 2022-08-13 VITALS — BP 95/64 | HR 97 | Ht 65.0 in | Wt 217.0 lb

## 2022-08-13 DIAGNOSIS — Z124 Encounter for screening for malignant neoplasm of cervix: Secondary | ICD-10-CM | POA: Insufficient documentation

## 2022-08-13 DIAGNOSIS — Z01419 Encounter for gynecological examination (general) (routine) without abnormal findings: Secondary | ICD-10-CM

## 2022-08-13 DIAGNOSIS — T8332XA Displacement of intrauterine contraceptive device, initial encounter: Secondary | ICD-10-CM

## 2022-08-13 DIAGNOSIS — Z30431 Encounter for routine checking of intrauterine contraceptive device: Secondary | ICD-10-CM | POA: Diagnosis not present

## 2022-08-13 NOTE — Progress Notes (Signed)
Gynecology Annual Exam  PCP: Orene Desanctis, MD  Chief Complaint:  Chief Complaint  Patient presents with   Gynecologic Exam    Discuss IUD, replace it today?    History of Present Illness: Patient is a 47 y.o. G2P2002 presents for annual exam. The patient has no gyn complaints today. She mentions having short and light monthly periods beginning in March of this year. Prior to that she had infrequent spotting. She would like to replace her current Mirena with new Mirena today. She reports IUD was placed in 2016. ASCUS PAP/negative HPV last year. Repeat PAP today.   LMP: Patient's last menstrual period was 08/06/2022 (exact date).  Postcoital Bleeding: no Dysmenorrhea: no   The patient is sexually active. She currently uses IUD for contraception. She denies dyspareunia.  The patient does perform self breast exams.  There is no notable family history of breast or ovarian cancer in her family.  The patient wears seatbelts: yes.   The patient has regular exercise:  She is resuming work outs in [employer]. She is on [facility] and has lost 15 pounds. Also improving diet and hydration .    The patient denies current symptoms of depression.    Review of Systems: Review of Systems  Constitutional:  Negative for chills and fever.  HENT:  Negative for congestion, ear discharge, ear pain, hearing loss, sinus pain and sore throat.   Eyes:  Negative for blurred vision and double vision.  Respiratory:  Negative for cough, shortness of breath and wheezing.   Cardiovascular:  Negative for chest pain, palpitations and leg swelling.  Gastrointestinal:  Negative for abdominal pain, blood in stool, constipation, diarrhea, heartburn, melena, nausea and vomiting.  Genitourinary:  Negative for dysuria, flank pain, frequency, hematuria and urgency.  Musculoskeletal:  Negative for back pain, joint pain and myalgias.  Skin:  Negative for itching and rash.  Neurological:  Negative for dizziness, tingling,  tremors, sensory change, speech change, focal weakness, seizures, loss of consciousness, weakness and headaches.  Endo/Heme/Allergies:  Negative for environmental allergies. Does not bruise/bleed easily.  Psychiatric/Behavioral:  Negative for depression, hallucinations, memory loss, substance abuse and suicidal ideas. The patient is not nervous/anxious and does not have insomnia.     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 Sees Dr. Cheree Ditto    Obesity (BMI 35.0-39.9 without comorbidity) 11/24/2015 07/17/2021   Generalized anxiety disorder 12/23/2014 07/17/2021    Past Surgical History:  Past Surgical History:  Procedure Laterality Date   NO PAST SURGERIES      Gynecologic History:  Patient's last menstrual period was 08/06/2022 (exact date).  Last Pap: 2023 Results were:  ASCUS with NEGATIVE high risk HPV  Last mammogram: 2023 Results were: BI-RAD II Benign  Obstetric History: G2P2002  Family History:  Family History  Problem Relation Age of Onset   COPD Mother    Hypercholesterolemia Father    Breast cancer Neg Hx     Social History:  Social History   Socioeconomic History   Marital status: Married    Spouse name: Not on file   Number of children: Not on file   Years of education: Not on file   Highest education level: Not on file  Occupational History   Not on file  Tobacco Use   Smoking status: Never   Smokeless tobacco: Never  Vaping Use  Vaping status: Never Used  Substance and Sexual Activity   Alcohol use: Yes    Comment: socially   Drug use: No   Sexual activity: Yes    Birth control/protection: I.U.D.    Comment: Mirena  Other Topics Concern   Not on file  Social History Narrative   Not on file   Social Determinants of Health   Financial Resource Strain: Patient Declined (04/27/2022)    Received from [facility], [facility]   Overall Financial Resource Strain (CARDIA)    Difficulty of Paying Living Expenses: Patient declined  Food Insecurity: Patient Declined (04/27/2022)   Received from [facility], [facility]   Hunger Vital Sign    Worried About Running Out of Food in the Last Year: Patient declined    Ran Out of Food in the Last Year: Patient declined  Transportation Needs: No Transportation Needs (04/27/2022)   Received from [facility], [facility]   [facility] - Transportation    In the past 12 months, has lack of transportation kept you from medical appointments or from getting medications?: No    Lack of Transportation (Non-Medical): No  Physical Activity: Not on file  Stress: Not on file  Social Connections: Not on file  Intimate Partner Violence: Not on file    Allergies:  No Known Allergies  Medications: Prior to Admission medications   Medication Sig Start Date End Date Taking? Authorizing Provider  citalopram (CELEXA) 20 MG tablet Take 20 mg by mouth daily. 12/03/16  Yes [provider]  levonorgestrel (MIRENA) 20 MCG/24HR IUD 1 each by Intrauterine route once.   Yes [provider]  lisinopril-hydrochlorothiazide (PRINZIDE,ZESTORETIC) 10-12.5 MG tablet Take 1 tablet by mouth daily. 12/03/16  Yes [provider]  lovastatin (MEVACOR) 40 MG tablet TAKE 1 TABLET (40 MG TOTAL) BY MOUTH DAILY WITH DINNER. 12/03/16  Yes [provider]  Semaglutide-Weight Management 0.5 MG/0.5ML SOAJ Inject into the skin. 07/13/22  Yes [provider]  traZODone (DESYREL) 50 MG tablet Take 50 mg by mouth at bedtime as needed. 05/20/20  Yes [provider]  Upadacitinib ER (RINVOQ) 30 MG TB24 Take by mouth.   Yes [provider]  Vitamin D, Ergocalciferol, (DRISDOL) 1.25 MG (50000 UNIT) CAPS capsule Take 50,000 Units  by mouth once a week. 08/04/22  Yes [provider]    Physical Exam Vitals: Blood pressure 95/64, pulse 97, height 5\' 5"  (1.651 m), weight 217 lb (98.4 kg), last menstrual period 08/06/2022.  General: NAD HEENT: normocephalic, anicteric Thyroid: no enlargement, no palpable nodules Pulmonary: No increased work of breathing, CTAB Cardiovascular: RRR, distal pulses 2+ Breast: Breast symmetrical, no tenderness, no palpable nodules or masses, no skin or nipple retraction present, no nipple discharge.  No axillary or supraclavicular lymphadenopathy. Abdomen: NABS, soft, non-tender, non-distended.  Umbilicus without lesions.  No hepatomegaly, splenomegaly or masses palpable. No evidence of hernia  Genitourinary:  External: Normal external female genitalia.  Normal urethral meatus, normal Bartholin's and Skene's glands.    Vagina: Normal vaginal mucosa, no evidence of prolapse.    Cervix: Grossly normal in appearance, no bleeding, IUD strings not visible and unable to remove IUD using hook  Uterus: Non-enlarged, mobile, normal contour.  No CMT  Adnexa: ovaries non-enlarged, no adnexal masses  Rectal: deferred  Lymphatic: no evidence of inguinal lymphadenopathy Extremities: no edema, erythema, or tenderness Neurologic: Grossly intact Psychiatric: mood appropriate, affect full  Ultrasound: IUD seen with  strings in uterus  Assessment: 47 y.o. G2P2002 routine annual exam  Plan: Problem List Items Addressed This Visit   None Visit Diagnoses     Well woman exam with routine gynecological exam    -  Primary   Relevant Orders   Cytology - PAP   Intrauterine contraceptive device threads lost, initial encounter       Relevant Orders   US PELVIS TRANSVAGINAL NON-OB (TV ONLY)   Screening for cervical cancer       Relevant Orders   Cytology - PAP       1) Mammogram - recommend yearly screening mammogram.  Mammogram  has been scheduled  2) STI screening  was offered and  declined  3) ASCCP guidelines and rationale discussed.  Patient opts for yearly screening interval until normal PAP  4) Contraception - the patient is currently using  IUD.  She is happy with her current form of contraception and plans to continue  5) Colonoscopy -- Screening recommended starting at age 42 for average risk individuals, age 69 for individuals deemed at increased risk (including African Americans) and recommended to continue until age 25.  For patient age 80-85 individualized approach is recommended.  Gold standard screening is via colonoscopy, Cologuard screening is an acceptable alternative for patient unwilling or unable to undergo colonoscopy.  "Colorectal cancer screening for average?risk adults: 2018 guideline update from the American Cancer Society"CA: A Cancer Journal for Clinicians: Jun 16, 2016. Colon cancer screening UTD.  6) Routine healthcare maintenance including cholesterol, diabetes screening discussed managed by PCP  7) Return in about 1 year (around 08/13/2023) for annual established gyn, needs IUD replacement appointment with MD.   Kim Martin, CNM [facility] 08/13/2022 9:19 AM

## 2022-08-24 ENCOUNTER — Ambulatory Visit: Payer: Managed Care, Other (non HMO)

## 2022-08-31 ENCOUNTER — Ambulatory Visit: Payer: Managed Care, Other (non HMO) | Admitting: Obstetrics and Gynecology

## 2022-08-31 ENCOUNTER — Encounter: Payer: Self-pay | Admitting: Obstetrics and Gynecology

## 2022-08-31 VITALS — BP 121/72 | HR 98 | Resp 16 | Ht 65.0 in | Wt 218.6 lb

## 2022-08-31 DIAGNOSIS — Z30433 Encounter for removal and reinsertion of intrauterine contraceptive device: Secondary | ICD-10-CM

## 2022-08-31 DIAGNOSIS — Z30430 Encounter for insertion of intrauterine contraceptive device: Secondary | ICD-10-CM

## 2022-08-31 MED ORDER — LEVONORGESTREL 20 MCG/DAY IU IUD
1.0000 | INTRAUTERINE_SYSTEM | Freq: Once | INTRAUTERINE | Status: AC
Start: 2022-08-31 — End: 2022-08-31
  Administered 2022-08-31: 1 via INTRAUTERINE

## 2022-08-31 NOTE — Progress Notes (Signed)
    GYNECOLOGY OFFICE PROCEDURE NOTE  Kim Martin is a 47 y.o. [phone] here for Mirena IUD removal and reinsertion. No GYN concerns.  Last pap smear was on 08/13/2022 and was normal.  Notes IUD retrieval was attempted at her last visit however IUD threads were lost and could not be retrieved.   IUD Removal and Reinsertion  Patient identified, informed consent performed, consent signed.   Discussed risks of irregular bleeding, cramping, infection, malpositioning or misplacement of the IUD outside the uterus which may require further procedures. Also discussed >99% contraception efficacy, increased risk of ectopic pregnancy with failure of method.   Emphasized that this did not protect against STIs, condoms recommended during all sexual encounters.Advised to use backup contraception for one week as the risk of pregnancy is higher during the transition period of removing an IUD and replacing it with another one. Time out was performed. Speculum placed in the vagina. The strings of the IUD were visualized (~ 0.5 cm in length), grasped and pulled using ring forceps. The IUD was successfully removed in its entirety. The cervix was cleaned with Betadine x 2 and grasped anteriorly with a single tooth tenaculum.  The new Mire IUD insertion apparatus was used to sound the uterus to 8 cm;  the IUD was then placed per manufacturer's recommendations. Strings trimmed to 3 cm. Tenaculum was removed, good hemostasis noted. Patient tolerated procedure well.   Patient was given post-procedure instructions.  She was reminded to have backup contraception for one week during this transition period between IUDs.  Patient was also asked to check IUD strings periodically and follow up in 4 weeks for IUD check.  Lot: [id number] Exp:08/2024   Hildred Laser, MD [facility]

## 2022-10-05 ENCOUNTER — Encounter: Payer: Self-pay | Admitting: Obstetrics and Gynecology

## 2022-10-05 ENCOUNTER — Ambulatory Visit: Payer: Managed Care, Other (non HMO) | Admitting: Obstetrics and Gynecology

## 2022-10-05 VITALS — BP 103/62 | HR 89 | Resp 16 | Ht 65.0 in | Wt 213.6 lb

## 2022-10-05 DIAGNOSIS — Z30431 Encounter for routine checking of intrauterine contraceptive device: Secondary | ICD-10-CM

## 2022-10-05 NOTE — Progress Notes (Signed)
    GYNECOLOGY OFFICE ENCOUNTER NOTE  History:  47 y.o. [id number] here today for today for IUD string check; Mirena  IUD was placed 08/31/2022. No complaints about the IUD, no concerning side effects. Does note that she has been spotting for the past week.  The following portions of the patient's history were reviewed and updated as appropriate: allergies, current medications, past family history, past medical history, past social history, past surgical history and problem list. Last pap smear on 08/13/2022 was normal, negative HRHPV.  Review of Systems:  Pertinent items are noted in HPI.  Objective:  Physical Exam Blood pressure 103/62, pulse 89, resp. rate 16, height 5\' 5"  (1.651 m), weight 213 lb 9.6 oz (96.9 kg). CONSTITUTIONAL: Well-developed, well-nourished female in no acute distress.  NEUROLOGIC: Alert and oriented to person, place, and time. Normal reflexes, muscle tone coordination.  ABDOMEN: Soft, no distention noted.   PELVIC: Normal appearing external genitalia; normal appearing vaginal mucosa and cervix.  IUD strings visualized, about 1 cm in length outside cervix. Done in the presence of a chaperone.  EXTREMITIES: Non-tender, no edema or cyanosis  Assessment & Plan:  Patient to keep IUD in place for up to 8 years; can come in for removal if she desires pregnancy earlier or for any concerning side effects.   Hildred Laser, MD [facility]

## 2022-10-27 ENCOUNTER — Inpatient Hospital Stay: Admission: RE | Admit: 2022-10-27 | Payer: Managed Care, Other (non HMO) | Source: Ambulatory Visit

## 2022-12-09 ENCOUNTER — Ambulatory Visit
Admission: RE | Admit: 2022-12-09 | Discharge: 2022-12-09 | Disposition: A | Discharge status: Home / Self Care | Payer: Managed Care, Other (non HMO) | Source: Ambulatory Visit | Attending: Advanced Practice Midwife | Admitting: Advanced Practice Midwife

## 2022-12-09 DIAGNOSIS — Z1231 Encounter for screening mammogram for malignant neoplasm of breast: Secondary | ICD-10-CM | POA: Diagnosis not present

## 2022-12-09 DIAGNOSIS — Z1239 Encounter for other screening for malignant neoplasm of breast: Secondary | ICD-10-CM | POA: Diagnosis present

## 2023-05-23 NOTE — Progress Notes (Signed)
 [facility] [place]  Annual Exam   Chief Complaint  Patient presents with  . Annual Exam    Subjective:  Kim Martin is a 48 y.o. female here for a health maintenance visit.   History of Present Illness Kim Martin is a 48 year old female who presents for an annual physical exam.  She prefers a colonoscopy for colon cancer screening due to her mother's recent experience with polyps found during her colonoscopy. Her mother has a history of lung cancer and COPD and is a smoker.  She has been actively involved in her mother's care, which has been stressful, but she manages her anxiety well with Celexa. She uses trazodone more frequently for sleep, taking 25 mg at night as needed. She experiences seasonal affective changes, feeling more active in the summer.  She is currently on lovastatin, which she takes at bedtime, and lisinopril 10 mg with HCTZ 12.5 mg for blood pressure management. She experienced swelling in her hands when she was off HCTZ for a couple of weeks, which resolved upon resuming the medication.  Denies side effects of medications.  She takes [place] for weight management and reports a steady weight loss, although slower than desired. She has started going to the gym regularly with her boss during lunch breaks and has made dietary changes such as sharing meals with her husband to control portion sizes.  She has a history of granuloma annulare and had been using Rinvoq, which she stopped six weeks ago due to suspected side effects of body odor. Since discontinuing Rinvoq, the odor has resolved, and she has not noticed a recurrence of granuloma annulare symptoms.  Seeing Dr. Arlyss with dermatology in the near future.  No results found for: DPCMALBCRERA  Wt Readings from Last 5 Encounters:  05/23/23 87.8 kg (193 lb 9 oz)  01/27/23 90.7 kg (200 lb)  10/07/22 95.8 kg (211 lb 3.2 oz)  07/13/22 99.9 kg (220 lb 3.2 oz)  06/04/22 (!) 104.3 kg (229 lb 15 oz)     PHQ 2/9 last 3 flowsheet values     04/28/2021    9:01 AM 04/29/2022    3:03 PM 05/23/2023    8:52 AM  PHQ-2/9 Depression Screening   Little interest or pleasure in doing things  0 0  Feeling down, depressed, or hopeless  0 0  Patient Health Questionnaire-2 Score  0 0 *  Trouble falling or staying asleep, or sleeping too much  0 0  Feeling tired or having little energy  0 1  Poor appetite or overeating  0 0  Feeling bad about yourself - or that you are a failure or have let yourself or your family down  0 0  Trouble concentrating on things, such as reading the newspaper or watching television  0 0  Moving or speaking so slowly that other people could have noticed? Or the opposite - being so fidgety or restless that you have been moving around a lot more than usual.   0  Thoughts that you would be better off dead or hurting yourself in some way  0 0  How difficult have these problems made it for you to do your work, take care of things at home, or get along with other people?   Not difficult at all  Patient Health Questionnaire-9 Score   1 *  (OBSOLETE) Little interest or pleasure in doing things 0    (OBSOLETE) Feeling down, depressed, or hopeless (or irritable for Teens  only)? 0    (OBSOLETE) Total Prescreening Score 0    (OBSOLETE) Total Score = 0      * Patient-reported   Lab Results  Component Value Date   TSH 1.03 01/27/2023    Prediabetes.  Cut out all soda.  Working on portion control.  Has lost weight. Lab Results  Component Value Date   HGBA1C 5.5 10/12/2022   HGBA1C 5.9 (H) 04/29/2022    Hyperlipidemia: Follow-Up: Pt feels this problem is: controlled Current symptoms: none Patient denies exertional chest pain, dyspnea, syncope, orthopnea, edema and or paroxysmal nocturnal dyspnea  Current medication: mevacor 40 mg qhs Medication side effects? none  The 10-year ASCVD risk score (Arnett DK, et al., 2019) is: 1.4%   Values used to calculate the score:     Age:  23 years     Sex: Female     Is Non-Hispanic African American: No     Diabetic: No     Tobacco smoker: No     Systolic Blood Pressure: 113 mmHg     Is BP treated: Yes     HDL Cholesterol: 37 mg/dL     Total Cholesterol: 165 mg/dL   Lipid:  Lab Results  Component Value Date   CHOLTOTAL 165 10/12/2022   CHOLTOTAL 201 04/29/2022   CHOLTOTAL 170 04/28/2021   Lab Results  Component Value Date   HDL 37 10/12/2022   HDL 41 04/29/2022   HDL 37 04/28/2021   Lab Results  Component Value Date   LDLCALC 77 10/12/2022   LDLCALC 111 04/29/2022   LDLCALC 102 04/28/2021   Lab Results  Component Value Date   TRIG 254 10/12/2022   TRIG 246 04/29/2022   TRIG 154 04/28/2021     Diet: 3-5 per day of fruits and vegetables, eats meat and fish, calcium intake with milk, cheese, yogurt.    Exercise: 3-5 times per week of walking, goes to gym and some weights  Outpatient Medications Prior to Visit  Medication Sig Dispense Refill  . citalopram (CELEXA) 20 MG tablet TAKE 1 TABLET BY MOUTH EVERY DAY 90 tablet 1  . hydroCHLOROthiazide (HYDRODIURIL) 12.5 MG tablet Take 1 tablet (12.5 mg total) by mouth once daily 30 tablet 11  . lisinopriL (ZESTRIL) 10 MG tablet Take 1 tablet (10 mg total) by mouth once daily 30 tablet 11  . MIRENA  20 mcg/24 hr (5 years) IUD     . semaglutide (WEGOVY) 2.4 mg/0.75 mL pen injector Inject 0.75 mLs (2.4 mg total) subcutaneously every 7 (seven) days 3 mL 11  . traZODone (DESYREL) 50 MG tablet Take 1 tablet (50 mg total) by mouth at bedtime as needed for Sleep 90 tablet 3  . upadacitinib (RINVOQ) 30 mg Tb24 Take 30 mg by mouth once daily    . lovastatin (MEVACOR) 40 MG tablet Take 1 tablet (40 mg total) by mouth daily with dinner 90 tablet 3   No facility-administered medications prior to visit.    Patient Active Problem List  Diagnosis  . Heart palpitations  . Dyslipidemia  . Generalized anxiety disorder  . Insomnia, transient  . Irritable bowel syndrome with  both constipation and diarrhea  . Obesity (BMI 35.0-39.9 without comorbidity), unspecified  . Hemorrhoids  . Essential hypertension  . Granuloma annulare  . Prediabetes    Past Medical History:  Diagnosis Date  . Abnormal finding on thyroid function test 12/23/2014  . Borderline high cholesterol 12/23/2014  . Essential hypertension 04/23/2020  . Generalized anxiety disorder 12/23/2014  .  Granuloma annulare 04/28/2021   Sees Dr. Arlyss  . Insomnia, transient 12/23/2014  . Irritable bowel syndrome with both constipation and diarrhea 12/23/2014    Past Surgical History:  Procedure Laterality Date  . Submandibular mass excision  02/2014   pathology negative    Family History  Problem Relation Name Age of Onset  . Depression Mother    . Lung cancer Mother    . COPD Mother    . Hyperlipidemia (Elevated cholesterol) Father    . Hyperlipidemia (Elevated cholesterol) Maternal Grandmother    . High blood pressure (Hypertension) Maternal Grandmother    . Hyperlipidemia (Elevated cholesterol) Maternal Grandfather    . High blood pressure (Hypertension) Maternal Grandfather    . Hyperlipidemia (Elevated cholesterol) Paternal Grandmother    . High blood pressure (Hypertension) Paternal Grandmother    . Hyperlipidemia (Elevated cholesterol) Paternal Grandfather    . High blood pressure (Hypertension) Paternal Grandfather       Health Habits: Social History   Socioeconomic History  . Marital status: Married  . Years of education: 69  Tobacco Use  . Smoking status: Never  . Smokeless tobacco: Never  Vaping Use  . Vaping status: Never Used  Substance and Sexual Activity  . Alcohol use: No  . Drug use: Never  . Sexual activity: Yes    Partners: Male    Birth control/protection: I.U.D.  Social History Narrative   Remarried 2021.  She shares custody with her ex husband but they have a good working relationship.  She works in Clinical biochemist for [employer].  She is a 82 year-old  daughter and an 18 year old son.  Non-smoking household.   Lapdog x 1   4/24   Social Drivers of Health   Financial Resource Strain: Low Risk  (05/23/2023)   Overall Financial Resource Strain (CARDIA)   . Difficulty of Paying Living Expenses: Not very hard  Food Insecurity: No Food Insecurity (05/23/2023)   Hunger Vital Sign   . Worried About Programme researcher, broadcasting/film/video in the Last Year: Never true   . Ran Out of Food in the Last Year: Never true  Transportation Needs: No Transportation Needs (05/23/2023)   PRAPARE - Transportation   . Lack of Transportation (Medical): No   . Lack of Transportation (Non-Medical): No     Health Maintenance: See under health Maintenance activity for review of completion dates as well. Immunization History  Administered Date(s) Administered  . COVID-19 Pfizer Monovalent Vaccine 09/26/2019, 10/22/2019  . Influenza, IM unspecified 10/18/2014  . TDAP (>=17YR) VACCINE (ADACEL/BOOSTRIX) 08/11/2011, 04/29/2022    Health Maintenance  Topic Date Due  . Colorectal Cancer Screening  05/07/2022  . Annual Physical/Well Child Check  04/30/2023  . COVID-19 Vaccine (3 - 2024-25 season) 10/07/2023 (Originally 09/19/2022)  . Influenza Vaccine (Season Ended) 09/19/2023  . Lipid Panel  10/12/2023  . Mammogram  12/09/2023  . Potassium Level  01/27/2024  . Serum Calcium  01/27/2024  . Depression Screening  05/22/2024  . Pap Smear  09/18/2025  . Adult Tetanus (Td And Tdap)  04/28/2032  . HIV Screen  Completed  . Hepatitis C Screen  Completed  . Hib Vaccines  Aged Out  . Hepatitis A Vaccines  Aged Out  . Meningococcal B Vaccine  Aged Out  . Meningococcal ACWY Vaccine  Aged Out  . Pneumococcal Vaccine  Aged Out  . HPV Vaccines  Aged Out  . Varicella Vaccines  Discontinued    Review of Systems  General: No fever, chills or  recent illness. + change in weight Skin:   See HPI HEENT: No change in vision or hearing. No pain or difficulty with swallowing Respiratory: No cough  or shortness of breath CV:  No chest pain or palpitations GI:  No pain, dyspepsia or change in bowel habits GU:  No dysuria, frequency, or hesitancy, difficulty starting or stopping stream of urine  MSK:  No joint pain or injury Neurological: NO numbness or change in sensation, no headaches Endocrine:  No heat or cold intolerance, polydipsia, polyuria, change in hair or skin, normal menses Allergic: No seasonal symptoms, sneezing, itchy eyes, runny nose, nasal congestion, postnasal drip Breast:  No masses, pain, nipple discharge, skin changes Objective:   Vitals:   05/23/23 0855  BP: 113/78  Pulse: 88  Weight: 87.8 kg (193 lb 9 oz)   Body mass index is 32.21 kg/m.  Nursing note and vitals reviewed.  Constit- Well-appearing, no distress, alert Eyes- anicteric, PERRL, sclera clear, lids normal Neck- Supple, no masses, no thyromegaly, shotty cervical lymphadenopathy Cor- Regular rate and rhythm, normal S1S2, no murmurs, no carotid bruit bilaterally Lungs- clear to ascultation bilaterally, no rhonchi, wheeze  Abd- Soft, non tender, non distended, positive bowel sounds, no masses or hepatosplenomegaly  GU-Pelvic exam: not indicated sees GYN  Breasts: not indicated routinely  sees GYN   Musculoskeletal- exhibits no edema. Grossly normal with full ROM  Lymphadenopathy-no cervical adenopathy, no axillary or inguinal adenopathy present.  Skin-dry in places but mostly clear Psych-alert and oriented 3, normal affect and mood    Assessment/Plan:   Diagnoses and all orders for this visit:  Routine general medical examination at health care facility  Essential hypertension -     Comprehensive Metabolic Panel (CMP); Future  Screening for colon cancer -     Ambulatory Referral to Colonoscopy  Depression screening (Z13.31) -     Depression Screen -(PHQ- 2/9, BDI)  Dyslipidemia -     Comprehensive Metabolic Panel (CMP); Future -     Lipid Panel W/Calculated Low Density Lipoprotein  (LDL) Cholesterol; Future -     lovastatin (MEVACOR) 40 MG tablet; Take 1 tablet (40 mg total) by mouth daily with dinner  Prediabetes -     Comprehensive Metabolic Panel (CMP); Future  Generalized anxiety disorder  Insomnia, transient  Granuloma annulare  Other orders -     Follow up in Primary Care; Future   Assessment & Plan Wellness Visit Annual wellness visit conducted. She prefers colonoscopy for colon cancer screening due to family history of polyps. Discussed the importance of regular screenings and procedure frequency if results are normal. - Refer to [facility] or [facility] for colonoscopy.  Obesity (BMI 30.0-34.9) Experiencing steady weight loss with Tzhncb. Engages in regular exercise, including weight training, to counteract muscle mass loss. Aims to lose an additional 30 pounds to reach a goal weight of 150 pounds. Discussed the importance of gradual weight loss and maintaining a healthy lifestyle. Started a gym routine with a partner for motivation.  Essential hypertension Blood pressure is well-controlled on lisinopril 10 mg and HCTZ 12.5 mg. Experienced swelling when off HCTZ, indicating its necessity.  Anxiety disorder Anxiety is well-managed with citalopram. Despite recent stressors, anxiety remains controlled. Uses trazodone as needed for sleep, with increased usage due to stress-related insomnia. Has sufficient trazodone supply but will need a refill when current supply is exhausted. - Refill trazodone as needed. - Refill citalopram when due in June.  Granuloma annulare Self-discontinued Rinvoq approximately six weeks ago due to  suspected side effect of body odor. No recurrence of granuloma annulare symptoms since discontinuation. Plans to discuss with dermatologist at next follow-up. This note has been created using automated tools and reviewed for accuracy by KAREN JUTTA BEHLING.   Encouraged exercise, wear seatbelt, wear sunscreen, eat foods rich in  calcium, brush teeth, see dentist regularly.     Follow-up     Future Labs/Procedures Expected by Expires   Follow up in Primary Care [[id number] Custom]  11/23/2023 01/23/2024   Questions:     Does this order need to be coordinated with another visit or should it be hidden from the patient portal? If yes to either, the patient will need to stop by the front desk or call to schedule.: No   Who is this follow-up with?: Me   Can this appointment be overbooked by a scheduler?: No   What type of follow up is needed?: Office Visit   What's the reason for follow up?: Obesity/Weight   Chronic Medical Problems   Allow telemedicine?: In Person Only            Future Appointments   This patient does not currently have any appointments scheduled.     Discussed the patient's BMI with patient. The BMI is above the acceptable range; discussed or provided materials on diet/exercise    Patient Instructions   *Some images could not be shown.

## 2023-08-15 NOTE — Patient Instructions (Addendum)
 COLONOSCOPY Suprep Bowel Prep PLEASE FOLLOW OUR INSTRUCTIONS, NOT INSTRUCTIONS ON PREP CONTAINER   1.  Your procedure is scheduled for     _9/17/25 WEDNESDAY_  2.  Your procedure is scheduled with  [x]   Dr. Elspeth Jungling   3.  Your procedure will be performed at: [x]   [facility]  4.  To get your scheduled arrival time:. [x]   [facility]-        [address]       [place]     [address]       PHONE : [phone]    They will contact you the day prior with  your arrival time on  _9/16/25 TUESDAY_.   STOP  []  Plavix   []  Coumadin  []  Pradaxa []  Other _______________________  7 Days Prior to your procedure stop your iron supplements - iron leaves a red residue in the colon and can appear as blood in your colon.  5 days prior to your procedure    1. No nuts, seeds, corn or popcorn   2. Eat a low fiber diet, this will help you clean out better for your procedure    3. START 1 DOSE OF MIRALAX DAILY      These are examples of low fiber foods.                  Cheese   Chicken  Fruit Juice(no pulp)  Yogurt    Turkey  Soda  Milk or Milk Products Lamb   Coffee  White Bread   Pork   Tea  Pasta    Eggs   White Rice    Fish/Seafood   Tofu   Peanut Butter(smooth) Applesauce   Custard  Sugar  Canned or cooked   Pudding  Sugar Substitute  Vegetables no seeds  Gelatin  Honey  Or skin             Ice Cream  Jelly      Cookies  2 days before your procedure _9/15/25 MONDAY_ eat breakfast before 10:00 AM. After breakfast start a FULL LIQUID DIET. AT 12:00 PM TAKE 2 DOCULAX TABLETS.    YOU WILL NEED TO PURCHASE THE FOLLOWING FROM THE PHARMACY:  SUPREP BOWEL KIT MIRALAX SMALL CONTAINER     The DAY BEFORE your procedure:   ____9/16/25 TUESDAY__      1.  If you are diabetic, please DO NOT take your diabetic pills. DIABETIC/ WEIGHT LOSS MEDICATIONS If you are on any of these medications HOLD for 7 days:  [x]  WEGOVY          IF YOU ARE ON ANY  OTHER ORAL DIABETIC MEDICATIONS DO NOT TAKE DAY OF PREP. YOU CAN RESUME AFTER PROCEDURE           Insulin Instructions:            [x]     Take 1/2 dose the evening before your procedure      2.  Drink clear liquids ALL DAY long. NO FOOD.  Clear liquids consist of anything you can hold up to light and see through.          Examples:   SOFT DRINKS- Orange, ginger ale, cola, sprite, 7-up, Gatorade, Kool-Aid(not red or purple) STRAINED FRUIT JUICES WITHOUT PULP- Apple, white grape, lemonade. WATER, TEA, COFFEE (no milk or non-dairy creamer) CHICKEN OR BEEF BROTH OR BOUILLON Chicken noodle soup NO noodles or chicken, beef broth from [employer], Wonton soup no  [place]onton) THE MORE FLAVORFUL THINGS YOU DRINK THE BETTER YOU WILL FEEL. HARD CANDIES, POPSICLES JELLO (lemon, lime, orange: NO FRUITS OR TOPPINGS)             NOTHING RED OR PURPLE.   THERE IS NO LIMIT ON THE AMOUNT OF CLEAR  LIQUIDS YOU CAN HAVE AND THE MORE YOU DRINK THE BETTER YOU WILL FEEL  AND THE BETTER YOUR CLEAN OUT WILL BE. Try to drink at least 1 glass every hour of clear fluids over the course of the day.      3.  At 6 pm pour one of the 6 ounce bottles of SUPREP liquid into the mixing container. Add cold drinking water to the 16 ounce line on the container and mix. Drink ALL the liquid in the container. You MUST drink TWO more 16 ounce containers of water over the next 2 hour.      4. No SMOKING, DIPPING, CHEWING TOBACCO OR CHEWING GUM after midnight the night before the test.         The DAY OF your procedure:    _9/17/25 WEDNESDAY_  1.  Starting 5 HOURS BEFORE ARRIVAL TIME pour remaining 6 ounces of SUPREP liquid in the mixing container. Add cold drinking water to the 16 ounce line on the container and mix.  Drink ALL the liquid in the container. You MUST drink TWO more 16 ounce containers of water over the next 2 hour.    **MAKE SURE YOU FINISH NO LATER THAN 3 HOURS PRIOR TO ARRIVAL TIME**  2.         [x]   If you  have a morning procedure, nothing to drink after midnight, except for your prep solution.             [x]  If you have an afternoon procedure, you  may have clear liquids up until 5 hours prior to the procedure but no carbonated drinks or broth. NOTHING RED OR PURPLE.     **Do NOT drink any other liquids after you complete prep solution and sips of water with medication **     You may take the following medication by 6 a.m. with just enough water to swallow your pills: Heart medication/Blood pressure Seizure medication Thyroid medication Parkinson's medication Breathing medication  Do not take Aspirin, Multivitamins, Fish Oil, Ibuprofen or NSAIDS the day of your procedure.  If you are diabetic, please DO NOT take your diabetic pills. Insulin Instructions:             []     Take 1/3 dose the morning of your procedure           [x]     Take 1/2 dose the morning of your procedure  PROCEDURE LOCATION INSTRUCTIONS  [x]   [facility]-Arrive at [facility] at your designated time.  Notify the nurse/doctor if you have had any changes in your medical condition since your last office visit.  You will receive medication to make you sleep for the test.  You MUST have someone drive you Home and be responsible for your care*.   You can NOT drive, work, or operate heavy machinery for 24 hours after the test.  After your procedure, the nurse will go over discharge instructions.  If you have any questions, call our office at [phone].  If you have billing questions, please call [facility] directly at [phone].  Additional Information: We recommend that you verify your insurance benefits and expected  out of pocket costs prior to your procedure. We also recommend that you verify providers including anesthesiologist and the chosen facility are in net-work. While our office will perform a prior authorization we will not know the cost of  your procedure.   You MUST have someone drive you Home from your procedure.  You must have a responsible adult with a valid Driver's License who is on site throughout your entire procedure and who can stay with you for several hours after your procedure. This applies for [facility]. Without verification that you have someone to do this, they will not proceed with your procedure. You may not go home alone in a taxi, shuttle [place], [place] or bus, as the drivers will not be responsible for you.   Please notify our office if your medical status changes BEFORE your procedure date. This includes new diagnoses, surgeries, medications, and hospital visits/admissions. Failure to do this can result in your procedure being cancelled.   To ensure a successful exam, please follow all instructions carefully. Failure to accurately and completely prepare for your exam may result in the need for an additional procedures will be billed to your insurance.  GI Financial Statement Policy:  Screening colonoscopy verses diagnostic colonoscopy: Insurance plans make a distinction between "screening" and "diagnostic" when determining benefit coverage for a colonoscopy. Many insurance plans cover "screening" colonoscopy procedures and pay up to 100% of the cost with minimal out-of-pocket cost to the patient. However, many insurance plans do not pay 100% of the cost of "diagnostic" colonoscopies, but pass on a portion of the cost to the patient through deductibles and coinsurance. The colonoscopy procedure is defined as "screening" or "diagnostic" based on specific rules and conditions. A colonoscopy is considered "screening" if the patient does not have any GI symptoms and no polyps or masses are found during the colonoscopy. Screening Colonoscopy:  No lower gastrointestinal signs or symptoms before the colonoscopy No polyps or masses are found during the colonoscopy You have no personal history of polyps or colon  cancer  Diagnostic Colonoscopy: If you have any of the following symptoms in the lower gastrointestinal tract noted in your medical record before the procedure, your colonoscopy will be diagnostic: Abdominal pain that does not improve Anemia  Change in bowel habits Constipation Diarrhea Rectal bleeding Blood in stool Polyps within the past 10 years A positive stool-based test or CT colonography- requiring a follow-up colonoscopy What you need to do: It is important for you to call your insurance provider before your colonoscopy appointment to  understand what your out-of-pocket cost will be.  Questions to ask your insurance company representative: If I am scheduled for a screening colonoscopy and the physician takes a biopsy or removes a polyp , will the claim still be covered under my preventative screening benefits with no out-of-pocket costs (e.g., deductible, coinsurance) for me? How is this procedure covered in relation to co-payment, deductible and co-insurance? How much am I responsible for?  Is my benefit period based on a calendar year (January-December); or a contract year (e.g., July 1-June 30)?   ANESTHESIA ANESTHESIA: PROPOFOL (anesthesia code for a preventative colonoscopy is 00812/diagnostic colonoscopy is 00811/EGD code is 00731)       IS THIS COVERED FOR MY PROCEDURE? AM I RESPONSIBLE FOR ANY OF THE COST? If you are not covered for Propofol call our office at [phone] to let us  know.  Propofol is ONLY used at [facility] and [facility]   *IF YOU HAVE A  PERSONAL HISTORY OF COLON POLYPS, BARRETT'S ESOPHAGUS, ULCERATIVE COLITIS OR CROHN'S, THIS IS CONSIDERED DIAGNOSTIC PER INSURANCE COMPANY POLICIES AND WILL BE COVERED UNDER YOUR DIAGNOSTIC BENEFITS, NOT SCREENING BENEFITS.    Insurance benefits vary depending on your policy. Some procedures may be covered at 100% or may be subject to the deductible. It is important for you to know your insurance benefits  BEFORE your procedure. Please contact the Gastro Department at [phone] if you have any questions

## 2023-10-05 ENCOUNTER — Ambulatory Visit: Payer: Self-pay

## 2023-10-05 DIAGNOSIS — K635 Polyp of colon: Secondary | ICD-10-CM | POA: Diagnosis not present

## 2023-10-05 DIAGNOSIS — K621 Rectal polyp: Secondary | ICD-10-CM | POA: Diagnosis not present

## 2023-10-05 DIAGNOSIS — Z83719 Family history of colon polyps, unspecified: Secondary | ICD-10-CM | POA: Diagnosis not present

## 2023-10-05 DIAGNOSIS — Z1211 Encounter for screening for malignant neoplasm of colon: Secondary | ICD-10-CM | POA: Diagnosis present

## 2023-10-26 NOTE — Progress Notes (Unsigned)
 Gynecology Annual Exam  PCP: Delfina Pao, MD  Chief Complaint: annual gyn visit   History of Present Illness: Patient is a 48 y.o. [id number] presents for annual exam. The patient has no gyn complaints today.   LMP: No LMP recorded. (Menstrual status: IUD). She has random spotting with current IUD Postcoital Bleeding: no Dysmenorrhea: no   The patient is sexually active. She currently uses IUD for contraception. She denies dyspareunia.  The patient does perform self breast exams.  There is no notable family history of breast or ovarian cancer in her family.  The patient wears seatbelts: yes.   The patient has regular exercise: she works out at [employer] 5 days per week and also walks regularly. She reports healthy lifestyle; diet, hydration, sleep.    The patient denies current symptoms of depression.    Review of Systems: Review of Systems  Constitutional:  Negative for chills and fever.  HENT:  Negative for congestion, ear discharge, ear pain, hearing loss, sinus pain and sore throat.   Eyes:  Negative for blurred vision and double vision.  Respiratory:  Negative for cough, shortness of breath and wheezing.   Cardiovascular:  Negative for chest pain, palpitations and leg swelling.  Gastrointestinal:  Negative for abdominal pain, blood in stool, constipation, diarrhea, heartburn, melena, nausea and vomiting.  Genitourinary:  Negative for dysuria, flank pain, frequency, hematuria and urgency.  Musculoskeletal:  Negative for back pain, joint pain and myalgias.  Skin:  Negative for itching and rash.  Neurological:  Negative for dizziness, tingling, tremors, sensory change, speech change, focal weakness, seizures, loss of consciousness, weakness and headaches.  Endo/Heme/Allergies:  Negative for environmental allergies. Does not bruise/bleed easily.  Psychiatric/Behavioral:  Negative for depression, hallucinations, memory loss, substance abuse and suicidal ideas. The patient is not  nervous/anxious and does not have insomnia.     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 Sees Dr. Arlyss    Essential hypertension 04/23/2020   Hemorrhoids 11/07/2018   Generalized anxiety disorder 12/23/2014   Dyslipidemia 12/23/2014    Past Surgical History:  Past Surgical History:  Procedure Laterality Date   NO PAST SURGERIES      Gynecologic History:  No LMP recorded. (Menstrual status: IUD).  Last Pap: 2024 Results were:  no abnormalities  Last mammogram: 12/09/22 Results were: BI-RAD I  Obstetric History: G2P2002  Family History:  Family History  Problem Relation Age of Onset   COPD Mother    Hypercholesterolemia Father    Breast cancer Neg Hx     Social History:  Social History   Socioeconomic History   Marital status: Married    Spouse name: Not on file   Number of children: Not on file   Years of education: Not on file   Highest education level: Not on file  Occupational History   Not on file  Tobacco Use   Smoking status: Never   Smokeless tobacco: Never  Vaping Use   Vaping status: Never Used  Substance and Sexual Activity   Alcohol use: Yes    Comment: socially   Drug use: No   Sexual activity: Yes    Birth control/protection: I.U.D.    Comment: Mirena   Other Topics Concern   Not on file  Social History Narrative   Not on file   Social Drivers of Health  Financial Resource Strain: Low Risk  (05/23/2023)   Received from [facility]   Overall Financial Resource Strain (CARDIA)    Difficulty of Paying Living Expenses: Not very hard  Food Insecurity: No Food Insecurity (05/23/2023)   Received from [facility]   Hunger Vital Sign    Within the past 12 months, you worried that your food would run out before you got the money to buy more.: Never true     Within the past 12 months, the food you bought just didn't last and you didn't have money to get more.: Never true  Transportation Needs: No Transportation Needs (05/23/2023)   Received from [facility] - Transportation    In the past 12 months, has lack of transportation kept you from medical appointments or from getting medications?: No    Lack of Transportation (Non-Medical): No  Physical Activity: Not on file  Stress: Not on file  Social Connections: Not on file  Intimate Partner Violence: Not on file    Allergies:  No Known Allergies  Medications: Prior to Admission medications   Medication Sig Start Date End Date Taking? Authorizing Provider  citalopram (CELEXA) 20 MG tablet Take 20 mg by mouth daily. 12/03/16   [provider]  lisinopril-hydrochlorothiazide (PRINZIDE,ZESTORETIC) 10-12.5 MG tablet Take 1 tablet by mouth daily. 12/03/16   [provider]  lovastatin (MEVACOR) 40 MG tablet TAKE 1 TABLET (40 MG TOTAL) BY MOUTH DAILY WITH DINNER. 12/03/16   [provider]  Semaglutide-Weight Management 0.5 MG/0.5ML SOAJ Inject into the skin. 07/13/22   [provider]  traZODone (DESYREL) 50 MG tablet Take 50 mg by mouth at bedtime as needed. 05/20/20   [provider]  Upadacitinib ER (RINVOQ) 30 MG TB24 Take by mouth.    [provider]  Vitamin D, Ergocalciferol, (DRISDOL) 1.25 MG (50000 UNIT) CAPS capsule Take 50,000 Units by mouth once a week. 08/04/22   [provider]    Physical Exam Vitals: BP 101/68   Pulse 80   Ht 5' 5 (1.651 m)   Wt 185 lb 3.2 oz (84 kg)   BMI 30.82 kg/m   General: NAD HEENT: normocephalic, anicteric Thyroid: no enlargement, no palpable nodules Pulmonary: No increased work of breathing, CTAB Cardiovascular: RRR, distal pulses 2+ Breast: Breast symmetrical, no tenderness, no palpable nodules or masses, no skin or nipple retraction present, no nipple  discharge.  No axillary or supraclavicular lymphadenopathy. Abdomen: NABS, soft, non-tender, non-distended.  Umbilicus without lesions.  No hepatomegaly, splenomegaly or masses palpable. No evidence of hernia  Genitourinary:  External: Normal external female genitalia.  Normal urethral meatus, normal Bartholin's and Skene's glands.    Vagina: Normal vaginal mucosa, no evidence of prolapse.    Cervix: normal in appearance, no bleeding. Strings visible; 2-3 cm  Uterus: Non-enlarged, mobile, normal contour.  No CMT  Adnexa: ovaries non-enlarged, no adnexal masses  Rectal: deferred  Lymphatic: no evidence of inguinal lymphadenopathy Extremities: no edema, erythema, or tenderness Neurologic: Grossly intact Psychiatric: mood appropriate, affect full   Assessment: 48 y.o. G2P2002 routine annual exam  Plan: Problem List Items Addressed This Visit   None Visit Diagnoses       Well woman exam with routine gynecological exam    -  Primary     Breast screening       Relevant Orders   MM DIGITAL SCREENING BILATERAL       1) Mammogram - recommend yearly screening mammogram.  Mammogram Was ordered today  2) STI screening  was offered and declined  3) ASCCP guidelines and rationale discussed.  Patient opts for every 5 years screening interval. Next PAP due in 2029.  4) Contraception - the patient is currently using  IUD.  She is happy with her current form of contraception and plans to continue  5) Colonoscopy -- Screening recommended starting at age 80 for average risk individuals, age 72 for individuals deemed at increased risk (including African Americans) and recommended to continue until age 23.  For patient age 48-85 individualized approach is recommended.  Gold standard screening is via colonoscopy, Cologuard screening is an acceptable alternative for patient unwilling or unable to undergo colonoscopy.  Colorectal cancer screening for average?risk adults: 2018 guideline update from the  American Cancer SocietyCA: A Cancer Journal for Clinicians: Jun 16, 2016. Had colonoscopy in 2025. Positive for polyps and recommendation for 3 year follow up.  6) Routine healthcare maintenance including cholesterol, diabetes screening discussed managed by PCP  7) Return in about 1 year (around 10/26/2024) for annual established gyn.   Slater Rains, CNM [facility] 10/27/2023 9:35 AM

## 2023-10-27 ENCOUNTER — Ambulatory Visit (INDEPENDENT_AMBULATORY_CARE_PROVIDER_SITE_OTHER): Admitting: Advanced Practice Midwife

## 2023-10-27 ENCOUNTER — Encounter: Payer: Self-pay | Admitting: Advanced Practice Midwife

## 2023-10-27 VITALS — BP 101/68 | HR 80 | Ht 65.0 in | Wt 185.2 lb

## 2023-10-27 DIAGNOSIS — Z01419 Encounter for gynecological examination (general) (routine) without abnormal findings: Secondary | ICD-10-CM

## 2023-10-27 DIAGNOSIS — Z1239 Encounter for other screening for malignant neoplasm of breast: Secondary | ICD-10-CM

## 2023-10-27 DIAGNOSIS — Z1231 Encounter for screening mammogram for malignant neoplasm of breast: Secondary | ICD-10-CM

## 2023-10-27 NOTE — Patient Instructions (Signed)
 Mediterranean Diet A Mediterranean diet is based on the traditions of countries on the [employer] [place]. It focuses on eating more: Fruits and vegetables. Whole grains, beans, nuts, and seeds. Heart-healthy fats. These are fats that are good for your heart. It involves eating less: Dairy. Meat and eggs. Processed foods with added sugar, salt, and fat. This type of diet can help prevent certain conditions. It can also improve outcomes if you have a long-term (chronic) disease, such as kidney or heart disease. What are tips for following this plan? Reading food labels Check packaged foods for: The serving size. For foods such as rice and pasta, the serving size is the amount of cooked product, not dry. The total fat. Avoid foods with saturated fat or trans fat. Added sugars, such as corn syrup. Shopping  Try to have a balanced diet. Buy a variety of foods, such as: Fresh fruits and vegetables. You may be able to get these from local farmers markets. You can also buy them frozen. Grains, beans, nuts, and seeds. Some of these can be bought in bulk. Fresh seafood. Poultry and eggs. Low-fat dairy products. Buy whole ingredients instead of foods that have already been packaged. If you can't get fresh seafood, buy precooked frozen shrimp or canned fish, such as tuna, salmon, or sardines. Stock your pantry so you always have certain foods on hand, such as olive oil, canned tuna, canned tomatoes, rice, pasta, and beans. Cooking Cook foods with extra-virgin olive oil instead of using butter or other vegetable oils. Have meat as a side dish. Have vegetables or grains as your main dish. This means having meat in small portions or adding small amounts of meat to foods like pasta or stew. Use beans or vegetables instead of meat in common dishes like chili or lasagna. Try out different cooking methods. Try roasting, broiling, steaming, and sauting vegetables. Add frozen vegetables to soups, stews,  pasta, or rice. Add nuts or seeds for added healthy fats and plant protein at each meal. You can add these to yogurt, salads, or vegetable dishes. Marinate fish or vegetables using olive oil, lemon juice, garlic, and fresh herbs. Meal planning Plan to eat a vegetarian meal one day each week. Try to work up to two vegetarian meals, if possible. Eat seafood two or more times a week. Have healthy snacks on hand. These may include: Vegetable sticks with hummus. Greek yogurt. Fruit and nut trail mix. Eat balanced meals. These should include: Fruit: 2-3 servings a day. Vegetables: 4-5 servings a day. Low-fat dairy: 2 servings a day. Fish, poultry, or lean meat: 1 serving a day. Beans and legumes: 2 or more servings a week. Nuts and seeds: 1-2 servings a day. Whole grains: 6-8 servings a day. Extra-virgin olive oil: 3-4 servings a day. Limit red meat and sweets to just a few servings a month. Lifestyle  Try to cook and eat meals with your family. Drink enough fluid to keep your pee (urine) pale yellow. Be active every day. This includes: Aerobic exercise, which is exercise that causes your heart to beat faster. Examples include running and swimming. Leisure activities like gardening, walking, or housework. Get 7-8 hours of sleep each night. Drink red wine if your provider says you can. A glass of wine is 5 oz (150 mL). You may be allowed to have: Up to 1 glass a day if you're female and not pregnant. Up to 2 glasses a day if you're female. What foods should I eat? Fruits Apples. Apricots. Avocado.  Berries. Bananas. Cherries. Dates. Figs. Grapes. Lemons. Melon. Oranges. Peaches. Plums. Pomegranate. Vegetables Artichokes. Beets. Broccoli. Cabbage. Carrots. Eggplant. Green beans. Chard. Kale. Spinach. Onions. Leeks. Peas. Squash. Tomatoes. Peppers. Radishes. Grains Whole-grain pasta. Brown rice. Bulgur wheat. Polenta. Couscous. Whole-wheat bread. [place] Madeira. Meats and other  proteins Beans. Almonds. Sunflower seeds. Pine nuts. Peanuts. Cod. Salmon. Scallops. Shrimp. Tuna. Tilapia. Clams. Oysters. Eggs. Chicken or [place] without skin. Dairy Low-fat milk. Cheese. Greek yogurt. Fats and oils Extra-virgin olive oil. Avocado oil. Grapeseed oil. Beverages Water. Red wine. Herbal tea. Sweets and desserts Greek yogurt with honey. Baked apples. Poached pears. Trail mix. Seasonings and condiments Basil. Cilantro. Coriander. Cumin. Mint. Parsley. Sage. Rosemary. Tarragon. Garlic. Oregano. Thyme. Pepper. Balsamic vinegar. Tahini. Hummus. Tomato sauce. Olives. Mushrooms. The items listed above may not be all the foods and drinks you can have. Talk to a dietitian to learn more. What foods should I limit? This is a list of foods that should be eaten rarely. Fruits Fruit canned in syrup. Vegetables Deep-fried potatoes, like [place] fries. Grains Packaged pasta or rice dishes. Cereal with added sugar. Snacks with added sugar. Meats and other proteins Beef. Pork. Lamb. Chicken or [place] with skin. Hot dogs. [place]. Dairy Ice cream. Sour cream. Whole milk. Fats and oils Butter. Canola oil. Vegetable oil. Beef fat (tallow). Lard. Beverages Juice. Sugar-sweetened soft drinks. Beer. Liquor and spirits. Sweets and desserts Cookies. Cakes. Pies. Candy. Seasonings and condiments Mayonnaise. Pre-made sauces and marinades. The items listed above may not be all the foods and drinks you should limit. Talk to a dietitian to learn more. Where to find more information American Heart Association (AHA): heart.org This information is not intended to replace advice given to you by your health care provider. Make sure you discuss any questions you have with your health care provider. Document Revised: 04/18/2022 Document Reviewed: 04/18/2022 Elsevier Patient Education  2024 [employer].

## 2023-11-18 ENCOUNTER — Other Ambulatory Visit: Payer: Self-pay | Admitting: Advanced Practice Midwife

## 2023-11-18 DIAGNOSIS — Z1231 Encounter for screening mammogram for malignant neoplasm of breast: Secondary | ICD-10-CM

## 2023-12-26 ENCOUNTER — Ambulatory Visit

## 2024-01-19 ENCOUNTER — Other Ambulatory Visit: Payer: Self-pay | Admitting: Medical Genetics

## 2024-01-25 ENCOUNTER — Ambulatory Visit

## 2024-02-15 ENCOUNTER — Ambulatory Visit

## 2024-03-13 ENCOUNTER — Ambulatory Visit
